# Patient Record
Sex: Male | Born: 1965 | Hispanic: No | Marital: Married | Smoking: Former smoker
Health system: Southern US, Community
[De-identification: ages and names within clinical notes are randomized; demographics above are authoritative.]

## PROBLEM LIST (undated history)

## (undated) DIAGNOSIS — E785 Hyperlipidemia, unspecified: Secondary | ICD-10-CM

## (undated) DIAGNOSIS — E0590 Thyrotoxicosis, unspecified without thyrotoxic crisis or storm: Secondary | ICD-10-CM

## (undated) DIAGNOSIS — R5383 Other fatigue: Secondary | ICD-10-CM

## (undated) DIAGNOSIS — M549 Dorsalgia, unspecified: Secondary | ICD-10-CM

## (undated) DIAGNOSIS — T7840XA Allergy, unspecified, initial encounter: Secondary | ICD-10-CM

## (undated) DIAGNOSIS — J45909 Unspecified asthma, uncomplicated: Secondary | ICD-10-CM

## (undated) DIAGNOSIS — R7309 Other abnormal glucose: Secondary | ICD-10-CM

## (undated) DIAGNOSIS — F411 Generalized anxiety disorder: Secondary | ICD-10-CM

## (undated) DIAGNOSIS — R5381 Other malaise: Secondary | ICD-10-CM

## (undated) DIAGNOSIS — I959 Hypotension, unspecified: Secondary | ICD-10-CM

## (undated) HISTORY — DX: Unspecified asthma, uncomplicated: J45.909

## (undated) HISTORY — DX: Generalized anxiety disorder: F41.1

## (undated) HISTORY — DX: Hyperlipidemia, unspecified: E78.5

## (undated) HISTORY — DX: Other abnormal glucose: R73.09

## (undated) HISTORY — DX: Hypotension, unspecified: I95.9

## (undated) HISTORY — DX: Other malaise: R53.81

## (undated) HISTORY — DX: Dorsalgia, unspecified: M54.9

## (undated) HISTORY — DX: Other fatigue: R53.83

## (undated) HISTORY — DX: Thyrotoxicosis, unspecified without thyrotoxic crisis or storm: E05.90

## (undated) HISTORY — DX: Allergy, unspecified, initial encounter: T78.40XA

---

## 2007-05-12 HISTORY — PX: COLONOSCOPY: SHX174

## 2009-01-15 ENCOUNTER — Ambulatory Visit: Payer: Self-pay | Admitting: Family Medicine

## 2009-01-15 DIAGNOSIS — E785 Hyperlipidemia, unspecified: Secondary | ICD-10-CM

## 2009-01-15 HISTORY — DX: Hyperlipidemia, unspecified: E78.5

## 2009-01-18 ENCOUNTER — Ambulatory Visit: Payer: Self-pay | Admitting: Family Medicine

## 2009-01-18 LAB — CONVERTED CEMR LAB
ALT: 24 units/L (ref 0–53)
AST: 20 units/L (ref 0–37)
Albumin: 4.1 g/dL (ref 3.5–5.2)
Alkaline Phosphatase: 35 units/L — ABNORMAL LOW (ref 39–117)
BUN: 15 mg/dL (ref 6–23)
Basophils Absolute: 0 10*3/uL (ref 0.0–0.1)
Bilirubin, Direct: 0 mg/dL (ref 0.0–0.3)
Chloride: 107 meq/L (ref 96–112)
Cholesterol: 217 mg/dL — ABNORMAL HIGH (ref 0–200)
Creatinine, Ser: 1.1 mg/dL (ref 0.4–1.5)
Direct LDL: 159.4 mg/dL
Eosinophils Relative: 4.4 % (ref 0.0–5.0)
GFR calc non Af Amer: 78.3 mL/min (ref >60)
HCT: 42.3 % (ref 39.0–52.0)
HDL: 42.3 mg/dL (ref >39.00)
MCV: 88.5 fL (ref 78.0–100.0)
Monocytes Absolute: 0.4 10*3/uL (ref 0.1–1.0)
Monocytes Relative: 8.8 % (ref 3.0–12.0)
Platelets: 146 10*3/uL — ABNORMAL LOW (ref 150.0–400.0)
Protein, U semiquant: NEGATIVE
RBC: 4.78 M/uL (ref 4.22–5.81)
Sodium: 141 meq/L (ref 135–145)
TSH: 1.85 microintl units/mL (ref 0.35–5.50)
Total Bilirubin: 0.9 mg/dL (ref 0.3–1.2)
Total CHOL/HDL Ratio: 5
Urobilinogen, UA: 0.2
WBC Urine, dipstick: NEGATIVE
pH: 5.5

## 2009-01-25 ENCOUNTER — Ambulatory Visit: Payer: Self-pay | Admitting: Family Medicine

## 2009-01-25 DIAGNOSIS — F411 Generalized anxiety disorder: Secondary | ICD-10-CM | POA: Insufficient documentation

## 2009-01-25 DIAGNOSIS — R7309 Other abnormal glucose: Secondary | ICD-10-CM

## 2009-01-25 HISTORY — DX: Generalized anxiety disorder: F41.1

## 2009-01-25 HISTORY — DX: Other abnormal glucose: R73.09

## 2009-01-29 ENCOUNTER — Encounter: Payer: Self-pay | Admitting: Family Medicine

## 2009-01-29 ENCOUNTER — Telehealth (INDEPENDENT_AMBULATORY_CARE_PROVIDER_SITE_OTHER): Payer: Self-pay | Admitting: *Deleted

## 2009-03-19 ENCOUNTER — Ambulatory Visit: Payer: Self-pay | Admitting: Family Medicine

## 2009-03-19 DIAGNOSIS — J45909 Unspecified asthma, uncomplicated: Secondary | ICD-10-CM

## 2009-03-19 HISTORY — DX: Unspecified asthma, uncomplicated: J45.909

## 2009-11-07 ENCOUNTER — Ambulatory Visit: Payer: Self-pay | Admitting: Family Medicine

## 2009-11-07 DIAGNOSIS — R5383 Other fatigue: Secondary | ICD-10-CM

## 2009-11-07 DIAGNOSIS — R5381 Other malaise: Secondary | ICD-10-CM

## 2009-11-07 DIAGNOSIS — I959 Hypotension, unspecified: Secondary | ICD-10-CM

## 2009-11-07 HISTORY — DX: Hypotension, unspecified: I95.9

## 2009-11-07 HISTORY — DX: Other malaise: R53.81

## 2009-11-08 ENCOUNTER — Ambulatory Visit: Payer: Self-pay | Admitting: Family Medicine

## 2009-11-12 LAB — CONVERTED CEMR LAB
BUN: 13 mg/dL (ref 6–23)
Basophils Absolute: 0 10*3/uL (ref 0.0–0.1)
Basophils Relative: 0.4 % (ref 0.0–3.0)
Calcium: 9 mg/dL (ref 8.4–10.5)
Cortisol, Plasma: 5.7 ug/dL
Creatinine, Ser: 0.8 mg/dL (ref 0.4–1.5)
Eosinophils Relative: 5.5 % — ABNORMAL HIGH (ref 0.0–5.0)
HCT: 39.2 % (ref 39.0–52.0)
MCHC: 33.8 g/dL (ref 30.0–36.0)
MCV: 86.9 fL (ref 78.0–100.0)
Monocytes Absolute: 0.7 10*3/uL (ref 0.1–1.0)
Neutrophils Relative %: 38 % — ABNORMAL LOW (ref 43.0–77.0)
Sodium: 142 meq/L (ref 135–145)
TSH: 0.05 microintl units/mL — ABNORMAL LOW (ref 0.35–5.50)
WBC: 4.6 10*3/uL (ref 4.5–10.5)

## 2009-11-13 ENCOUNTER — Ambulatory Visit: Payer: Self-pay | Admitting: Family Medicine

## 2009-11-13 LAB — CONVERTED CEMR LAB: TSH: 0.04 microintl units/mL — ABNORMAL LOW (ref 0.35–5.50)

## 2009-11-14 ENCOUNTER — Ambulatory Visit: Payer: Self-pay | Admitting: Family Medicine

## 2009-11-14 DIAGNOSIS — E0590 Thyrotoxicosis, unspecified without thyrotoxic crisis or storm: Secondary | ICD-10-CM

## 2009-11-14 HISTORY — DX: Thyrotoxicosis, unspecified without thyrotoxic crisis or storm: E05.90

## 2009-12-03 ENCOUNTER — Encounter (HOSPITAL_COMMUNITY): Admission: RE | Admit: 2009-12-03 | Discharge: 2010-01-30 | Payer: Self-pay | Admitting: Family Medicine

## 2009-12-11 ENCOUNTER — Encounter: Payer: Self-pay | Admitting: Family Medicine

## 2009-12-23 ENCOUNTER — Ambulatory Visit: Payer: Self-pay | Admitting: Family Medicine

## 2010-02-10 ENCOUNTER — Encounter: Payer: Self-pay | Admitting: Family Medicine

## 2010-06-04 ENCOUNTER — Ambulatory Visit
Admission: RE | Admit: 2010-06-04 | Discharge: 2010-06-04 | Payer: Self-pay | Source: Home / Self Care | Attending: Family Medicine | Admitting: Family Medicine

## 2010-06-04 DIAGNOSIS — M549 Dorsalgia, unspecified: Secondary | ICD-10-CM | POA: Insufficient documentation

## 2010-06-04 HISTORY — DX: Dorsalgia, unspecified: M54.9

## 2010-06-10 NOTE — Assessment & Plan Note (Signed)
Summary: consult re: thyroid issues/cjr   Vital Signs:  Patient profile:   45 year old male Temp:     98.3 degrees F oral BP sitting:   100 / 80  (left arm) Cuff size:   regular  Vitals Entered By: Sid Falcon LPN (December 23, 2009 4:37 PM) CC: consult   History of Present Illness: Recent issues of fatigue. Evaulation here signif for hyperthyroid. Nuclear scan showed changes c/w likely acute thyroiditis. Pt referred to Endo and antibody levels revealed no evidence for Graves.  At this point plan is for pt to have repeat thyroid levels at end of month.  No meds prescribed. Pt had some mild weight loss and mild tremors and these have resolved.  No diarrhea or palpitations. Pt was happy with care he received from Endo but a couple of his friends had suggested another endocrinologist in town.  Pt has hx mild hyperlipidemia and has some questions regarding that. He still has some fatigue and thinks this may me related partly to poor sleep. He is back to exercising some.  HDL low at 28 with recent labs at work.  Allergies (verified): No Known Drug Allergies  Past History:  Past Medical History: Last updated: 01/15/2009 Hyperlipidemia  Social History: Last updated: 01/15/2009 Occupation: Education officer, environmental Married Alcohol use-yes Former Smoker PMH reviewed for relevance, SH/Risk Factors reviewed for relevance  Review of Systems      See HPI  The patient denies anorexia, fever, weight loss, vision loss, hoarseness, chest pain, syncope, dyspnea on exertion, peripheral edema, headaches, abdominal pain, and muscle weakness.    Physical Exam  General:  Well-developed,well-nourished,in no acute distress; alert,appropriate and cooperative throughout examination Eyes:  pupils equal, pupils round, and pupils reactive to light.   Mouth:  Oral mucosa and oropharynx without lesions or exudates.  Teeth in good repair. Neck:  No deformities, masses, or tenderness  noted. Lungs:  Normal respiratory effort, chest expands symmetrically. Lungs are clear to auscultation, no crackles or wheezes. Heart:  Normal rate and regular rhythm. S1 and S2 normal without gallop, murmur, click, rub or other extra sounds. Extremities:  no edema.  No tremor noted. Neurologic:  alert & oriented X3, cranial nerves II-XII intact, strength normal in all extremities, and gait normal.   Skin:  no rashes.     Impression & Recommendations:  Problem # 1:  HYPERTHYROIDISM (ICD-242.90) Acute thyroiditis with symptoms resolving.  We explained some pts subsequently become hypothyroid and he is encouraged to follow up for repeat labs. Discussed issues with Endocrinology and he is encouraged to f/u with endo he is currently seeing. He is symptomatically stable.  Problem # 2:  HYPERLIPIDEMIA (ICD-272.4) pt has several questions regarding dyspipidemia.  Explained we would not rec recheck at this time until thyroid settles out.  Complete Medication List: 1)  No Meds

## 2010-06-10 NOTE — Assessment & Plan Note (Signed)
Summary: F/U ON LABWORK // RS   Vital Signs:  Patient profile:   45 year old male Weight:      169 pounds Temp:     98.8 degrees F oral Pulse rate:   68 / minute Pulse rhythm:   regular BP sitting:   110 / 72  (left arm)  Vitals Entered By: Sid Falcon LPN (November 15, 6043 8:25 AM)  History of Present Illness: Here for follow up .  Recent symptoms of fatigue and dyspnea with some activity. Slight tremor.  No diarrhea except for one episode 2 days ago.  Mild weight loss. Labs signif for low TSH and high T4 and T3.  No signif palpitations and no chest pain.  Overall symptoms stable since last visit. Had normal TSH last Fall.  Allergies (verified): No Known Drug Allergies  Past History:  Past Medical History: Last updated: 01/15/2009 Hyperlipidemia  Family History: Last updated: 01/15/2009 Family History High cholesterol  Social History: Last updated: 01/15/2009 Occupation: Education officer, environmental Married Alcohol use-yes Former Smoker  Risk Factors: Smoking Status: quit (01/15/2009) PMH-FH-SH reviewed for relevance  Review of Systems       The patient complains of weight loss.  The patient denies anorexia, fever, chest pain, syncope, peripheral edema, headaches, and abdominal pain.    Physical Exam  General:  Well-developed,well-nourished,in no acute distress; alert,appropriate and cooperative throughout examination Eyes:  no exopthalmous. pupils equal, pupils round, and pupils reactive to light.   Mouth:  Oral mucosa and oropharynx without lesions or exudates.  Teeth in good repair. Neck:  No deformities, masses, or tenderness noted. Lungs:  Normal respiratory effort, chest expands symmetrically. Lungs are clear to auscultation, no crackles or wheezes. Heart:  Normal rate and regular rhythm. S1 and S2 normal without gallop, murmur, click, rub or other extra sounds. Extremities:  no edema. Neurologic:  alert & oriented X3, cranial nerves II-XII intact, and  strength normal in all extremities.   Skin:  no rashes.     Impression & Recommendations:  Problem # 1:  HYPERTHYROIDISM (ICD-242.90) Assessment New thyroid uptake scan and endocrinology referral.  No betablocker with heart rate 68. Orders: Radiology Referral (Radiology) Endocrinology Referral (Endocrine)  Problem # 2:  FATIGUE (ICD-780.79) prob sec to #1.  Complete Medication List: 1)  No Meds   Patient Instructions: 1)  Follow up if you note any increased heart rate, palpitations, diarrhea, or rapid weight loss.

## 2010-06-10 NOTE — Letter (Signed)
Summary: [facility]   Imported By: Maryln Gottron 02/20/2010 14:08:34  _____________________________________________________________________  External Attachment:    Type:   Image     Comment:   External Document

## 2010-06-10 NOTE — Consult Note (Signed)
Summary: [facility] at [facility] at [facility]   Imported By: Maryln Gottron 02/12/2010 13:13:08  _____________________________________________________________________  External Attachment:    Type:   Image     Comment:   External Document

## 2010-06-10 NOTE — Assessment & Plan Note (Signed)
Summary: concerned that BP may be low? 100/62/dm   Vital Signs:  Patient profile:   45 year old male Weight:      168 pounds BMI:     25.64 O2 Sat:      97 % on Room air Temp:     97.8 degrees F oral BP sitting:   100 / 72  (left arm) BP standing:   96 / 60 Cuff size:   regular  Vitals Entered By: Sid Falcon LPN (November 07, 2009 10:04 AM)  O2 Flow:  Room air  Serial Vital Signs/Assessments:  Time      Position  BP       Pulse  Resp  Temp     By           Lying LA  98/60                          Evelena Peat MD           Standing  96/60                          Evelena Peat MD   History of Present Illness: Patient seen with concern for low blood pressure. No hypertension hx and on NO medications at this time.  He had several readings in the 90 systolic range recently. He has not however had any dizziness or orthostatic symptoms. He relates for approximately one month he has felt more fatigued. He did some [place] the mountains last month and denies any chest pain or exertional dyspnea at that time. Has developed some dyspnea at rest past couple of days. No orthopnea or paroxysmal nocturnal dyspnea. He denies any cough or wheeze.  No pleuritic pain or hemoptysis. New dog in the home past couple days but denies any nasal congestive symptoms. Fleeting posterior occipital headaches past couple days which have been mild. No visual changes. No focal weakness.  He does not take any medications. No recent concerns for dehydration. No history of anemia. No recent diarrhea, nausea, or vomiting. No definite fevers. In addition to profound weakness he has noticed that he has had very mild tremor in the hands past couple days.  No recent weight changes.  Allergies (verified): No Known Drug Allergies  Past History:  Past Medical History: Last updated: 01/15/2009 Hyperlipidemia  Family History: Last updated: 01/15/2009 Family History High cholesterol  Social History: Last  updated: 01/15/2009 Occupation: Education officer, environmental Married Alcohol use-yes Former Smoker  Risk Factors: Smoking Status: quit (01/15/2009) PMH-FH-SH reviewed for relevance  Review of Systems  The patient denies anorexia, fever, weight loss, vision loss, chest pain, syncope, peripheral edema, prolonged cough, hemoptysis, abdominal pain, melena, hematochezia, difficulty walking, depression, and enlarged lymph nodes.    Physical Exam  General:  Well-developed,well-nourished,in no acute distress; alert,appropriate and cooperative throughout examination Head:  Normocephalic and atraumatic without obvious abnormalities. No apparent alopecia or balding. Eyes:  No corneal or conjunctival inflammation noted. EOMI. Perrla. Funduscopic exam benign, without hemorrhages, exudates or papilledema. Vision grossly normal. Mouth:  tongue slightly dry otherwise clear Neck:  No deformities, masses, or tenderness noted. Lungs:  Normal respiratory effort, chest expands symmetrically. Lungs are clear to auscultation, no crackles or wheezes. Heart:  Normal rate and regular rhythm. S1 and S2 normal without gallop, murmur, click, rub or other extra sounds. Abdomen:  soft and non-tender.   Pulses:  R and L carotid,radial,femoral,dorsalis pedis  and posterior tibial pulses are full and equal bilaterally Extremities:  no edema. Neurologic:  alert & oriented X3, cranial nerves II-XII intact, strength normal in all extremities, gait normal, and DTRs symmetrical and normal.  no signif tremor at this time. Skin:  no rashes.   Cervical Nodes:  No lymphadenopathy noted Psych:  Oriented X3, memory intact for recent and remote, normally interactive, good eye contact, not anxious appearing, and not depressed appearing.     Impression & Recommendations:  Problem # 1:  FATIGUE (ICD-780.79) Assessment New ?related to low BP.  Check labs-BMP, CBC, TSH, Cortisol in AM.  Problem # 2:  LOW BLOOD PRESSURE  (ICD-458.9) Assessment: New No orthostatic change today.  ?mild dehydration.  Does not appear anemic clinically.   Check labs.  Problem # 3:  HYPERLIPIDEMIA (ICD-272.4) pt has hx hyperlipidemia and requests reassessment with upcoming labs.  Complete Medication List: 1)  No Meds   Patient Instructions: 1)  have scheduled the following labs for tomorrow morning: 2)  BMP, CBC,Cortisol level, TSH  780.79, 458.9 3)  Lipid panel  272.4 4)  Drink plenty of fluids and consider electrolyte replacement such as Gatorade

## 2010-06-12 NOTE — Assessment & Plan Note (Signed)
Summary: FATIGUE/?TSH/NJR   Vital Signs:  Patient profile:   45 year old male Weight:      172 pounds Temp:     98.0 degrees F oral BP sitting:   92 / 72  (left arm) Cuff size:   regular  Vitals Entered By: Sid Falcon LPN (June 04, 2010 4:12 PM)  History of Present Illness: Seen for the following.  Patient had recent lab work which revealed no immunity to Varicella. He was immune to measles, mumps, and rubella. Requesting Varicella vaccine. No contraindications.  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 Recent thyroid functions per endocrinologist were normal. Symptoms have resolved. He had some mild tachycardia and palpitations which have resolved. Overall energy levels have improved.  Chronic intermittent problem upper shoulder and neck pain. Frequent sensation of muscle tightness. Tends to carry tension in his upper back and neck. Has tried muscle massage, physical therapy, heat all with minimal improvement. Generally sleeping well. Aerobic exercise 3-4 days per week.  Pain is moderate.  Allergies (verified): No Known Drug Allergies  Past History:  Past Medical History: Last updated: 01/15/2009 Hyperlipidemia  Family History: Last updated: 01/15/2009 Family History High cholesterol  Social History: Last updated: 01/15/2009 Occupation: Education officer, environmental Married Alcohol use-yes Former Smoker  Risk Factors: Smoking Status: quit (01/15/2009) PMH-FH-SH reviewed for relevance  Review of Systems  The patient denies anorexia, fever, weight loss, weight gain, decreased hearing, hoarseness, chest pain, syncope, dyspnea on exertion, peripheral edema, prolonged cough, headaches, hemoptysis, abdominal pain, melena, hematochezia, severe indigestion/heartburn, muscle weakness, difficulty walking, depression, unusual weight change, enlarged lymph nodes, and testicular masses.    Physical Exam  General:  Well-developed,well-nourished,in no  acute distress; alert,appropriate and cooperative throughout examination Head:  Normocephalic and atraumatic without obvious abnormalities. No apparent alopecia or balding. Eyes:  No corneal or conjunctival inflammation noted. EOMI. Perrla. Funduscopic exam benign, without hemorrhages, exudates or papilledema. Vision grossly normal. Mouth:  Oral mucosa and oropharynx without lesions or exudates.  Teeth in good repair. Neck:  No deformities, masses, or tenderness noted. Lungs:  Normal respiratory effort, chest expands symmetrically. Lungs are clear to auscultation, no crackles or wheezes. Heart:  normal rate, regular rhythm, and no murmur.   Msk:  patient has significant muscle tenderness and tension to palpation trapezius muscles bilateral Extremities:  No clubbing, cyanosis, edema, or deformity noted with normal full range of motion of all joints.   Neurologic:  alert & oriented X3 and cranial nerves II-XII intact.   Skin:  no rashes and no suspicious lesions.   Cervical Nodes:  No lymphadenopathy noted   Impression & Recommendations:  Problem # 1:  BACK PAIN, UPPER (ICD-724.5)  continue heat, muscle massage, aerobic exercise. Consider physical therapy. Trial of low-dose cyclobenzaprine 5 mg q.h.s. His updated medication list for this problem includes:    Cyclobenzaprine Hcl 5 Mg Tabs (Cyclobenzaprine hcl) ..... One by mouth at bedtime as needed muscle spasm  Orders: Physical Therapy Referral (PT)  Problem # 2:  NEED PROPH VACC&INOCULAT AGAINST VARICELLA (ICD-V05.4) vaccine given.  Problem # 3:  HYPERTHYROIDISM (ICD-242.90) Assessment: Improved  Complete Medication List: 1)  No Meds  2)  Cyclobenzaprine Hcl 5 Mg Tabs (Cyclobenzaprine hcl) .... One by mouth at bedtime as needed muscle spasm  Other Orders: Varicella  (04540) Admin 1st Vaccine (98119)  Patient Instructions: 1)  Continue regular aerobic exercise 2)  Continue moist heat and muscle message. 3)  Consider repeat  physical therapy if symptoms persist. Prescriptions: CYCLOBENZAPRINE HCL 5  MG TABS (CYCLOBENZAPRINE HCL) one by mouth at bedtime as needed muscle spasm  #30 x 2   Entered and Authorized by:   Evelena Peat MD   Signed by:   Evelena Peat MD on 06/04/2010   Method used:   Electronically to        [facility] (retail)       [address]       [place], [place]  [address]       Ph: [phone] or [phone]       Fax: [phone]   RxID:   [id number]    Orders Added: 1)  Varicella  [90716] 2)  Admin 1st Vaccine [90471] 3)  Est. Patient Level IV [27253] 4)  Physical Therapy Referral [PT]   Immunizations Administered:  Varicella Vaccine # 1:    Vaccine Type: Varicella    Site: right deltoid    Mfr: Merck    Dose: 0.5 ml    Route: [place]    Given by: Sid Falcon LPN    Exp. Date: 10/10/2011    Lot #: [id number]   Immunizations Administered:  Varicella Vaccine # 1:    Vaccine Type: Varicella    Site: right deltoid    Mfr: Merck    Dose: 0.5 ml    Route: [place]    Given by: Sid Falcon LPN    Exp. Date: 10/10/2011    Lot #: [id number]

## 2010-12-24 ENCOUNTER — Other Ambulatory Visit (INDEPENDENT_AMBULATORY_CARE_PROVIDER_SITE_OTHER): Payer: BC Managed Care – PPO

## 2010-12-24 DIAGNOSIS — Z0000 Encounter for general adult medical examination without abnormal findings: Secondary | ICD-10-CM

## 2010-12-24 LAB — HEPATIC FUNCTION PANEL
ALT: 23 U/L (ref 0–53)
AST: 36 U/L (ref 0–37)
Albumin: 4.3 g/dL (ref 3.5–5.2)
Alkaline Phosphatase: 42 U/L (ref 39–117)
Total Bilirubin: 0.4 mg/dL (ref 0.3–1.2)
Total Protein: 7.5 g/dL (ref 6.0–8.3)

## 2010-12-24 LAB — CBC WITH DIFFERENTIAL/PLATELET
Basophils Absolute: 0 10*3/uL (ref 0.0–0.1)
Eosinophils Relative: 6 % — ABNORMAL HIGH (ref 0.0–5.0)
HCT: 42.7 % (ref 39.0–52.0)
Lymphocytes Relative: 45.7 % (ref 12.0–46.0)
Lymphs Abs: 2.2 10*3/uL (ref 0.7–4.0)
MCV: 88.4 fl (ref 78.0–100.0)
Monocytes Absolute: 0.4 10*3/uL (ref 0.1–1.0)
Monocytes Relative: 8.2 % (ref 3.0–12.0)
Neutro Abs: 1.9 10*3/uL (ref 1.4–7.7)
Platelets: 156 10*3/uL (ref 150.0–400.0)
RBC: 4.82 Mil/uL (ref 4.22–5.81)
RDW: 14.2 % (ref 11.5–14.6)
WBC: 4.8 10*3/uL (ref 4.5–10.5)

## 2010-12-24 LAB — POCT URINALYSIS DIPSTICK
Bilirubin, UA: NEGATIVE
Blood, UA: NEGATIVE
Leukocytes, UA: NEGATIVE
Protein, UA: NEGATIVE
Spec Grav, UA: 1.01

## 2010-12-24 LAB — LIPID PANEL
Cholesterol: 216 mg/dL — ABNORMAL HIGH (ref 0–200)
Total CHOL/HDL Ratio: 4
VLDL: 38 mg/dL (ref 0.0–40.0)

## 2010-12-24 LAB — BASIC METABOLIC PANEL
Chloride: 106 mEq/L (ref 96–112)
GFR: 81.86 mL/min (ref >60.00)
Potassium: 4.7 mEq/L (ref 3.5–5.1)

## 2010-12-31 ENCOUNTER — Encounter: Payer: Self-pay | Admitting: Family Medicine

## 2010-12-31 ENCOUNTER — Ambulatory Visit (INDEPENDENT_AMBULATORY_CARE_PROVIDER_SITE_OTHER): Payer: BC Managed Care – PPO | Admitting: Family Medicine

## 2010-12-31 VITALS — BP 100/70 | HR 60 | Temp 98.1°F | Resp 12 | Ht 67.5 in | Wt 167.0 lb

## 2010-12-31 DIAGNOSIS — Z0000 Encounter for general adult medical examination without abnormal findings: Secondary | ICD-10-CM

## 2010-12-31 LAB — VITAMIN B12: Vitamin B-12: 395 pg/mL (ref 211–911)

## 2010-12-31 MED ORDER — TRIAMCINOLONE ACETONIDE 0.1 % EX CREA: TOPICAL_CREAM | Freq: Two times a day (BID) | CUTANEOUS | Status: DC

## 2010-12-31 MED ORDER — ESCITALOPRAM OXALATE 10 MG PO TABS: 10.0000 mg | ORAL_TABLET | Freq: Every day | ORAL | Status: DC

## 2010-12-31 NOTE — Progress Notes (Signed)
Subjective:    Patient ID: Curtis Rodriguez, male    DOB: 09/29/1965, 45 y.o.   MRN: [id number]  HPI Patient seen for complete physical examination. He has history of transient hyperthyroidism which is resolved at this time. Prior history of mild dyslipidemia. He has history of chronic low-grade anxiety and takes very low dose Lexapro which seems to control. Recently started [employer].  No dairy products. Requesting B12 level. He made this dietary change about 5 months ago. Patient relates tetanus booster about 6 months ago.  Some chronic cervical neck and upper back pain which she relates to job stress. Exercising with running which seems to help somewhat. Exacerbated by weight lifting. No radiculopathy symptoms.  Social history and family history reviewed. Father with type 2 diabetes  Past Medical History  Diagnosis Date  . HYPERTHYROIDISM 11/14/2009  . HYPERLIPIDEMIA 01/15/2009  . Anxiety state, unspecified 01/25/2009  . LOW BLOOD PRESSURE 11/07/2009  . 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 FATIGUE 11/07/2009  . PREDIABETES 01/25/2009  . BACK PAIN, UPPER 06/04/2010   History reviewed. No pertinent past surgical history.  reports that he quit smoking about 4 years ago. His smoking use included Cigarettes. He has a 17.6 pack-year smoking history. He does not have any smokeless tobacco history on file. His alcohol and drug histories not on file. family history includes Diabetes in his mother and Hypertension in his mother. No Known Allergies    Review of Systems  Constitutional: Negative for fever, activity change, appetite change and fatigue.  HENT: Negative for ear pain, congestion and trouble swallowing.   Eyes: Negative for pain and visual disturbance.  Respiratory: Negative for cough, shortness of breath and wheezing.   Cardiovascular: Negative for chest pain and palpitations.  Gastrointestinal: Negative for nausea, vomiting, abdominal pain, diarrhea, constipation, blood in stool, abdominal  distention and rectal pain.  Genitourinary: Negative for dysuria, hematuria and testicular pain.  Musculoskeletal: Positive for back pain. Negative for myalgias, joint swelling and arthralgias.  Skin: Negative for rash.  Neurological: Negative for dizziness, syncope, weakness and headaches.  Hematological: Negative for adenopathy.  Psychiatric/Behavioral: Negative for confusion and dysphoric mood.       Objective:   Physical Exam  Constitutional: He is oriented to person, place, and time. He appears well-developed and well-nourished. No distress.  HENT:  Head: Normocephalic and atraumatic.  Right Ear: External ear normal.  Left Ear: External ear normal.  Mouth/Throat: Oropharynx is clear and moist.  Eyes: Conjunctivae and EOM are normal. Pupils are equal, round, and reactive to light.  Neck: Normal range of motion. Neck supple. No thyromegaly present.  Cardiovascular: Normal rate, regular rhythm and normal heart sounds.   No murmur heard. Pulmonary/Chest: No respiratory distress. He has no wheezes. He has no rales.  Abdominal: Soft. Bowel sounds are normal. He exhibits no distension and no mass. There is no tenderness. There is no rebound and no guarding.  Genitourinary: Rectum normal and prostate normal.  Musculoskeletal: He exhibits no edema.  Lymphadenopathy:    He has no cervical adenopathy.  Neurological: He is alert and oriented to person, place, and time. He displays normal reflexes. No cranial nerve deficit.  Skin: No rash noted.  Psychiatric: He has a normal mood and affect. His behavior is normal.          Assessment & Plan:  Health maintenance exam. Labs reviewed with patient and all basically favorable. Add B12 and vitamin D level to recent labs. Patient has increased risk for B12 deficiency with  current diet. Confirm date of last tetanus. Exercise discussed

## 2011-01-02 ENCOUNTER — Telehealth: Payer: Self-pay | Admitting: Family Medicine

## 2011-01-02 ENCOUNTER — Other Ambulatory Visit: Payer: Self-pay | Admitting: Family Medicine

## 2011-01-02 MED ORDER — ERGOCALCIFEROL 1.25 MG (50000 UT) PO CAPS: 50000.0000 [IU] | ORAL_CAPSULE | ORAL | Status: AC

## 2011-01-02 NOTE — Telephone Encounter (Signed)
Pt called 8/24. Stated he spoke with you or you called this morning and said his Vitamin D would be called into the [facility] on [address]. He does not have it yet and was checking in. Please call.

## 2011-01-02 NOTE — Progress Notes (Signed)
Quick Note:  Pt informed, Rx sent, future lab order done ______

## 2011-01-02 NOTE — Telephone Encounter (Signed)
Rx sent, pt informed. 

## 2011-04-30 ENCOUNTER — Ambulatory Visit (INDEPENDENT_AMBULATORY_CARE_PROVIDER_SITE_OTHER): Payer: BC Managed Care – PPO | Admitting: Family Medicine

## 2011-04-30 ENCOUNTER — Encounter: Payer: Self-pay | Admitting: Family Medicine

## 2011-04-30 DIAGNOSIS — J111 Influenza due to unidentified influenza virus with other respiratory manifestations: Secondary | ICD-10-CM

## 2011-04-30 MED ORDER — OSELTAMIVIR PHOSPHATE 75 MG PO CAPS: 75.0000 mg | ORAL_CAPSULE | Freq: Two times a day (BID) | ORAL | Status: AC

## 2011-04-30 NOTE — Patient Instructions (Signed)
Influenza Facts Flu (influenza) is a contagious respiratory illness caused by the influenza viruses. It can cause mild to severe illness. While most healthy people recover from the flu without specific treatment and without complications, older people, young children, and people with certain health conditions are at higher risk for serious complications from the flu, including death. CAUSES   The flu virus is spread from person to person by respiratory droplets from coughing and sneezing.   A person can also become infected by touching an object or surface with a virus on it and then touching their mouth, eye or nose.   Adults may be able to infect others from 1 day before symptoms occur and up to 7 days after getting sick. So it is possible to give someone the flu even before you know you are sick and continue to infect others while you are sick.  SYMPTOMS   Fever (usually high).   Headache.   Tiredness (can be extreme).   Cough.   Sore throat.   Runny or stuffy nose.   Body aches.   Diarrhea and vomiting may also occur, particularly in children.   These symptoms are referred to as "flu-like symptoms". A lot of different illnesses, including the common cold, can have similar symptoms.  DIAGNOSIS   There are tests that can determine if you have the flu as long you are tested within the first 2 or 3 days of illness.   A doctor's exam and additional tests may be needed to identify if you have a disease that is a complicating the flu.  RISKS AND COMPLICATIONS  Some of the complications caused by the flu include:  Bacterial pneumonia or progressive pneumonia caused by the flu virus.   Loss of body fluids (dehydration).   Worsening of chronic medical conditions, such as heart failure, asthma, or diabetes.   Sinus problems and ear infections.  HOME CARE INSTRUCTIONS   Seek medical care early on.   If you are at high risk from complications of the flu, consult your health-care  provider as soon as you develop flu-like symptoms. Those at high risk for complications include:   People 65 years or older.   People with chronic medical conditions, including diabetes.   Pregnant women.   Young children.   Your caregiver may recommend use of an antiviral medication to help treat the flu.   If you get the flu, get plenty of rest, drink a lot of liquids, and avoid using alcohol and tobacco.   You can take over-the-counter medications to relieve the symptoms of the flu if your caregiver approves. (Never give aspirin to children or teenagers who have flu-like symptoms, particularly fever).  PREVENTION  The single best way to prevent the flu is to get a flu vaccine each fall. Other measures that can help protect against the flu are:  Antiviral Medications   A number of antiviral drugs are approved for use in preventing the flu. These are prescription medications, and a doctor should be consulted before they are used.   Habits for Good Health   Cover your nose and mouth with a tissue when you cough or sneeze, throw the tissue away after you use it.   Wash your hands often with soap and water, especially after you cough or sneeze. If you are not near water, use an alcohol-based hand cleaner.   Avoid people who are sick.   If you get the flu, stay home from work or school. Avoid contact with   other people so that you do not make them sick, too.   Try not to touch your eyes, nose, or mouth as germs ore often spread this way.  IN CHILDREN, EMERGENCY WARNING SIGNS THAT NEED URGENT MEDICAL ATTENTION:  Fast breathing or trouble breathing.   Bluish skin color.   Not drinking enough fluids.   Not waking up or not interacting.   Being so irritable that the child does not want to be held.   Flu-like symptoms improve but then return with fever and worse cough.   Fever with a rash.  IN ADULTS, EMERGENCY WARNING SIGNS THAT NEED URGENT MEDICAL ATTENTION:  Difficulty  breathing or shortness of breath.   Pain or pressure in the chest or abdomen.   Sudden dizziness.   Confusion.   Severe or persistent vomiting.  SEEK IMMEDIATE MEDICAL CARE IF:  You or someone you know is experiencing any of the symptoms above. When you arrive at the emergency center,report that you think you have the flu. You may be asked to wear a mask and/or sit in a secluded area to protect others from getting sick. MAKE SURE YOU:   Understand these instructions.   Monitor your condition.   Seek medical care if you are getting worse, or not improving.  Document Released: 04/30/2003 Document Revised: 01/07/2011 Document Reviewed: 01/24/2009 Exit[employer] Patient Information 2012 [employer]. 

## 2011-04-30 NOTE — Progress Notes (Signed)
  Subjective:    Patient ID: Curtis Rodriguez, male    DOB: 19-Dec-1965, 45 y.o.   MRN: [id number]  HPI  Acute visit. Patient relates onset about 40 hours ago of increased sneezing, nasal congestion, fatigue, body aches, chills, and sore throat. No cough. Daughter is sick with somewhat similar symptoms. Patient did not receive flu vaccine. Denies any nausea, vomiting, or diarrhea.   Review of Systems  Constitutional: Positive for chills and fatigue.  HENT: Positive for congestion and sore throat.   Respiratory: Negative for cough.   Cardiovascular: Negative for chest pain.  Neurological: Positive for headaches.       Objective:   Physical Exam  Constitutional: He appears well-developed and well-nourished. No distress.  HENT:  Right Ear: External ear normal.  Left Ear: External ear normal.  Mouth/Throat: Oropharynx is clear and moist.  Neck: Neck supple.  Cardiovascular: Normal rate and regular rhythm.   Pulmonary/Chest: Effort normal and breath sounds normal. No respiratory distress. He has no wheezes. He has no rales.  Lymphadenopathy:    He has no cervical adenopathy.          Assessment & Plan:  Probable viral syndrome. Check influenza screen. If negative treat symptomatically. If positive consider Tamiflu Influenza screen positive.  Tamiflu 75 mg po bid for 5 days.

## 2011-05-08 ENCOUNTER — Other Ambulatory Visit: Payer: Self-pay | Admitting: Family Medicine

## 2011-05-08 NOTE — Telephone Encounter (Signed)
Pt was informed on his personally identified VM that refill of Tamiflu was unlikely.  I was hoping to talk to pt to gather additional information and symptoms.  Per Dr Lovell Sheehan, refill not indicated.

## 2011-05-08 NOTE — Telephone Encounter (Signed)
Pt called req refill of Tamiflu to [facility] on [facility].

## 2011-05-11 ENCOUNTER — Encounter: Payer: Self-pay | Admitting: Family Medicine

## 2011-05-11 ENCOUNTER — Ambulatory Visit (INDEPENDENT_AMBULATORY_CARE_PROVIDER_SITE_OTHER): Payer: BC Managed Care – PPO | Admitting: Family Medicine

## 2011-05-11 VITALS — BP 98/70 | Temp 97.9°F

## 2011-05-11 DIAGNOSIS — Z113 Encounter for screening for infections with a predominantly sexual mode of transmission: Secondary | ICD-10-CM

## 2011-05-11 DIAGNOSIS — J029 Acute pharyngitis, unspecified: Secondary | ICD-10-CM

## 2011-05-11 NOTE — Progress Notes (Signed)
  Subjective:    Patient ID: Curtis Rodriguez, male    DOB: Oct 06, 1965, 45 y.o.   MRN: [id number]  HPI  Acute visit. Recent influenza. Overall better but recurrence of sore throat last week. Fever has resolved. Denies cough. No post nasal drip symptoms. Denies body aches. No nausea or vomiting. No sick contacts.  Patient also requesting STD screening. Denies any symptoms such as dysuria, burning, urine frequency, or any skin rash. No prior history of STD   Review of Systems  HENT: Positive for sore throat. Negative for congestion, mouth sores and sinus pressure.   Respiratory: Negative for cough and shortness of breath.   Cardiovascular: Negative for chest pain.  Gastrointestinal: Negative for nausea and vomiting.  Genitourinary: Negative for dysuria, discharge, genital sores and penile pain.  Skin: Negative for rash.  Neurological: Negative for dizziness and headaches.  Hematological: Negative for adenopathy.       Objective:   Physical Exam  Constitutional: He appears well-developed and well-nourished.  HENT:  Right Ear: External ear normal.  Left Ear: External ear normal.       Minimal erythema posterior pharynx. No exudate  Neck: Neck supple.  Cardiovascular: Normal rate and regular rhythm.   Pulmonary/Chest: Effort normal and breath sounds normal. No respiratory distress. He has no wheezes. He has no rales.  Lymphadenopathy:    He has no cervical adenopathy.          Assessment & Plan:  #1 acute pharyngitis. Probably viral. Check rapid strep. If negative treat symptomatically #2 STD screening. Check HIV, RPR, GC, and Chlamydia.  Discussed STD prevention.

## 2011-05-12 LAB — GC/CHLAMYDIA PROBE AMP, URINE: Chlamydia, Swab/Urine, PCR: NEGATIVE

## 2011-05-12 LAB — HIV ANTIBODY (ROUTINE TESTING W REFLEX): HIV: NONREACTIVE

## 2011-05-14 NOTE — Progress Notes (Signed)
Quick Note:  Pt aware ______ 

## 2011-05-27 ENCOUNTER — Encounter: Payer: Self-pay | Admitting: Family Medicine

## 2011-05-27 ENCOUNTER — Ambulatory Visit (INDEPENDENT_AMBULATORY_CARE_PROVIDER_SITE_OTHER): Payer: BC Managed Care – PPO | Admitting: Family Medicine

## 2011-05-27 VITALS — BP 110/70 | Temp 97.7°F

## 2011-05-27 DIAGNOSIS — J029 Acute pharyngitis, unspecified: Secondary | ICD-10-CM

## 2011-05-27 LAB — POCT RAPID STREP A (OFFICE): Rapid Strep A Screen: NEGATIVE

## 2011-05-27 NOTE — Progress Notes (Signed)
  Subjective:    Patient ID: Curtis Rodriguez, male    DOB: October 15, 1965, 46 y.o.   MRN: [id number]  HPI  Patient seen with sore throat. Had recent influenza and symptoms eventually resolved. Last weekend when he was traveling on business had sore throat occasional cough. Mild body aches. Mild headache. Sore throat actually improved somewhat today. No purulent nasal discharge. No definite fever. No sick exposures.   Review of Systems As per history of present illness    Objective:   Physical Exam  HENT:  Head: Normocephalic and atraumatic.  Right Ear: External ear normal.  Left Ear: External ear normal.       Patient some posterior pharynx erythema. No exudate  Neck: Neck supple.  Cardiovascular: Normal rate and regular rhythm.   Pulmonary/Chest: He has no wheezes. He has no rales.  Lymphadenopathy:    He has cervical adenopathy.          Assessment & Plan:  Pharyngitis. Suspect viral. Rapid strep. If negative treat symptomatically

## 2011-05-27 NOTE — Patient Instructions (Signed)

## 2011-07-14 IMAGING — NM NM THYROID IMAGING W/ UPTAKE SINGLE (24 HR)
1 series · 1 of 1 positions shown · non-contrast
Comparison: None.

12/06/2009 – CORRECTED EXAM ORDER:  This exam was incorrectly ordered and should be NM THYROID UPTAKE 24 HOURS.  Charges to the patient’s account have been corrected, and no changes were made to the original report.
CLINICAL DATA: Hyperthyroidism.

 THYROID UPTAKE - 24 HOURS
TECHNIQUE: Following the per oral administration of X-101 sodium
 iodide, the patient returned at 24 hours and uptake measurements
 were acquired with the uptake probe centered on the neck.
 [employer]: 12.2 uCi X-101 Sodium Iodide.

[th thyroid scan · 1.14mm/px · 1 of 1 slices shown]
[im 1/1]
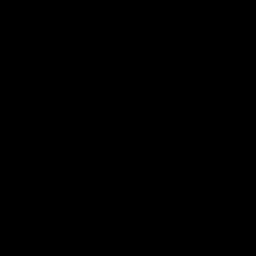

[1 of 1 positions shown; findings below may reference images not displayed]

FINDINGS: The 24 hour X-101 uptake was 0.5%. This could be due to
 acute thyroiditis or exogenous iodine uptake.
IMPRESSION: Very low 24 hour X-101 uptake at 0.5%.

## 2011-08-10 ENCOUNTER — Encounter: Payer: Self-pay | Admitting: Family

## 2011-08-10 ENCOUNTER — Ambulatory Visit (INDEPENDENT_AMBULATORY_CARE_PROVIDER_SITE_OTHER): Payer: BC Managed Care – PPO | Admitting: Family

## 2011-08-10 VITALS — BP 110/80 | Temp 98.0°F | Wt 170.0 lb

## 2011-08-10 DIAGNOSIS — E559 Vitamin D deficiency, unspecified: Secondary | ICD-10-CM

## 2011-08-10 DIAGNOSIS — R5381 Other malaise: Secondary | ICD-10-CM

## 2011-08-10 DIAGNOSIS — R5383 Other fatigue: Secondary | ICD-10-CM

## 2011-08-10 MED ORDER — KETOROLAC TROMETHAMINE 60 MG/2ML IM SOLN: 60.0000 mg | Freq: Once | INTRAMUSCULAR | Status: AC

## 2011-08-10 MED ORDER — NAPROXEN 500 MG PO TABS: 500.0000 mg | ORAL_TABLET | Freq: Two times a day (BID) | ORAL | Status: DC

## 2011-08-10 NOTE — Patient Instructions (Signed)
Cervical Radiculopathy Cervical radiculopathy happens when a nerve in the neck is pinched or bruised by a slipped (herniated) disk or by arthritic changes in the bones of the cervical spine. This can occur due to an injury or as part of the normal aging process. Pressure on the cervical nerves can cause pain or numbness that runs from your neck all the way down into your arm and fingers. CAUSES  There are many possible causes, including:  Injury.   Muscle tightness in the neck from overuse.   Swollen, painful joints (arthritis).   Breakdown or degeneration in the bones and joints of the spine (spondylosis) due to aging.   Bone spurs that may develop near the cervical nerves.  SYMPTOMS  Symptoms include pain, weakness, or numbness in the affected arm and hand. Pain can be severe or irritating. Symptoms may be worse when extending or turning the neck. DIAGNOSIS  Your caregiver will ask about your symptoms and do a physical exam. He or she may test your strength and reflexes. X-rays, CT scans, and MRI scans may be needed in cases of injury or if the symptoms do not go away after a period of time. Electromyography (EMG) or nerve conduction testing may be done to study how your nerves and muscles are working. TREATMENT  Your caregiver may recommend certain exercises to help relieve your symptoms. Cervical radiculopathy can, and often does, get better with time and treatment. If your problems continue, treatment options may include:  Wearing a soft collar for short periods of time.   Physical therapy to strengthen the neck muscles.   Medicines, such as nonsteroidal anti-inflammatory drugs (NSAIDs), oral corticosteroids, or spinal injections.   Surgery. Different types of surgery may be done depending on the cause of your problems.  HOME CARE INSTRUCTIONS   Put ice on the affected area.   Put ice in a plastic bag.   Place a towel between your skin and the bag.   Leave the ice on for 15  to 20 minutes, 3 to 4 times a day or as directed by your caregiver.   Use a flat pillow when you sleep.   Only take over-the-counter or prescription medicines for pain, discomfort, or fever as directed by your caregiver.   If physical therapy was prescribed, follow your caregiver's directions.   If a soft collar was prescribed, use it as directed.  SEEK IMMEDIATE MEDICAL CARE IF:   Your pain gets much worse and cannot be controlled with medicines.   You have weakness or numbness in your hand, arm, face, or leg.   You have a high fever or a stiff, rigid neck.   You lose bowel or bladder control (incontinence).   You have trouble with walking, balance, or speaking.  MAKE SURE YOU:   Understand these instructions.   Will watch your condition.   Will get help right away if you are not doing well or get worse.  Document Released: 01/20/2001 Document Revised: 04/16/2011 Document Reviewed: 12/09/2010 [employer] Patient Information 2012 [employer]. 

## 2011-08-10 NOTE — Progress Notes (Signed)
Subjective:    Patient ID: Curtis Rodriguez, male    DOB: [birth year], 46 y.o.   MRN: [id number]  HPI Comments: C/o fatigue x three days. Was told vitamin D levels was low and was placed on vitamin d for the last 3-4 months and was instructed to stop taking and repeat labs. Also c/o chronic neck and bilat shoulder pain related to injury 8 years ago. MRI completed at time of injury. Rates pain as 8/10 and pain gets worse when attempting to work out at the gym or perform yard work. Moist heat helps to alleviate discomfort. Described as Nagging discomfort. Takes OTC advil occasionally with some relief.      Review of Systems  Constitutional: Positive for appetite change.  Respiratory: Negative.   Cardiovascular: Negative.   Musculoskeletal: Positive for arthralgias. Negative for myalgias, back pain, joint swelling and gait problem.   Past Medical History  Diagnosis Date  . HYPERTHYROIDISM 11/14/2009  . HYPERLIPIDEMIA 01/15/2009  . Anxiety state, unspecified 01/25/2009  . LOW BLOOD PRESSURE 11/07/2009  . 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 FATIGUE 11/07/2009  . PREDIABETES 01/25/2009  . BACK PAIN, UPPER 06/04/2010    History   Social History  . Marital Status: Married    Spouse Name: N/A    Number of Children: N/A  . Years of Education: N/A   Occupational History  . Not on file.   Social History Main Topics  . Smoking status: Former Smoker -- 0.8 packs/day for 22 years    Types: Cigarettes    Quit date: 12/31/2006  . Smokeless tobacco: Not on file  . Alcohol Use: Not on file  . Drug Use: Not on file  . Sexually Active: Not on file   Other Topics Concern  . Not on file   Social History Narrative  . No narrative on file    No past surgical history on file.  Family History  Problem Relation Age of Onset  . Diabetes Mother   . Hypertension Mother     No Known Allergies  Current Outpatient Prescriptions on File Prior to Visit  Medication Sig Dispense Refill  . ergocalciferol  (VITAMIN D2) 50000 UNITS capsule Take 1 capsule (50,000 Units total) by mouth once a week.  4 capsule  12  . escitalopram (LEXAPRO) 10 MG tablet Take 1 tablet (10 mg total) by mouth daily.  30 tablet  3  . triamcinolone (KENALOG) 0.1 % cream Apply topically 2 (two) times daily.  30 g  3   No current facility-administered medications on file prior to visit.    BP 110/80  Temp(Src) 98 F (36.7 C) (Oral)  Wt 170 lb (77.111 kg)chart     Objective:   Physical Exam  Constitutional: He is oriented to person, place, and time. He appears well-developed and well-nourished. No distress.  Cardiovascular: Normal rate, regular rhythm, normal heart sounds and intact distal pulses.  Exam reveals no gallop and no friction rub.   No murmur heard. Pulmonary/Chest: Effort normal. No respiratory distress. He has no wheezes. He has no rales. He exhibits no tenderness.  Musculoskeletal: Normal range of motion. He exhibits no edema and no tenderness.  Neurological: He is alert and oriented to person, place, and time.  Skin: Skin is warm and dry. He is not diaphoretic.          Assessment & Plan:  Assessment: Chronic neck and shoulder pain, fatigue, vitamin d deficiency Plan: Labs: CBC, BMP, vitamin d level,  naproxen, teaching handout provided on  diagnosis and treatment Encouraged to RTC if fatigue or pain gets worse

## 2011-08-11 LAB — CBC WITH DIFFERENTIAL/PLATELET
Basophils Relative: 0.6 % (ref 0.0–3.0)
Eosinophils Absolute: 0.5 10*3/uL (ref 0.0–0.7)
HCT: 41.5 % (ref 39.0–52.0)
Hemoglobin: 13.9 g/dL (ref 13.0–17.0)
MCHC: 33.4 g/dL (ref 30.0–36.0)
MCV: 87.3 fl (ref 78.0–100.0)
Monocytes Relative: 5.9 % (ref 3.0–12.0)
Neutrophils Relative %: 44.7 % (ref 43.0–77.0)
Platelets: 168 10*3/uL (ref 150.0–400.0)
RDW: 13.7 % (ref 11.5–14.6)
WBC: 5.3 10*3/uL (ref 4.5–10.5)

## 2011-08-11 LAB — BASIC METABOLIC PANEL
Calcium: 9.2 mg/dL (ref 8.4–10.5)
Chloride: 103 mEq/L (ref 96–112)
Glucose, Bld: 101 mg/dL — ABNORMAL HIGH (ref 70–99)

## 2011-08-11 LAB — TSH: TSH: 1.41 u[IU]/mL (ref 0.35–5.50)

## 2011-08-12 LAB — VITAMIN D 1,25 DIHYDROXY
Vitamin D 1, 25 (OH)2 Total: 42 pg/mL (ref 18–72)
Vitamin D3 1, 25 (OH)2: 20 pg/mL

## 2011-11-23 ENCOUNTER — Ambulatory Visit (INDEPENDENT_AMBULATORY_CARE_PROVIDER_SITE_OTHER): Payer: BC Managed Care – PPO | Admitting: Family Medicine

## 2011-11-23 ENCOUNTER — Encounter: Payer: Self-pay | Admitting: Family Medicine

## 2011-11-23 VITALS — BP 102/72 | Temp 97.8°F | Wt 171.0 lb

## 2011-11-23 DIAGNOSIS — E559 Vitamin D deficiency, unspecified: Secondary | ICD-10-CM

## 2011-11-23 DIAGNOSIS — E785 Hyperlipidemia, unspecified: Secondary | ICD-10-CM

## 2011-11-23 LAB — LIPID PANEL
Cholesterol: 231 mg/dL — ABNORMAL HIGH (ref 0–200)
Triglycerides: 126 mg/dL (ref 0.0–149.0)

## 2011-11-23 NOTE — Progress Notes (Signed)
  Subjective:    Patient ID: Curtis Rodriguez, male    DOB: [birth year], 46 y.o.   MRN: [id number]  HPI  To discuss the following items  Hyperlipidemia. Recent health screening at work. Total cholesterol 239, HDL 39 and glucose 96. No LDL done. No triglycerides. No family history of premature CAD. Patient has recently gained some weight. He is exercising about 3 days per week. No chest pains. Nonsmoker. No history of hypertension.  History of vitamin D deficiency. Vitamin D level last year 35. Takes vitamin D 50,000 units weekly but occasionally skips doses.  Past Medical History  Diagnosis Date  . HYPERTHYROIDISM 11/14/2009  . HYPERLIPIDEMIA 01/15/2009  . Anxiety state, unspecified 01/25/2009  . LOW BLOOD PRESSURE 11/07/2009  . 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 FATIGUE 11/07/2009  . PREDIABETES 01/25/2009  . BACK PAIN, UPPER 06/04/2010   No past surgical history on file.  reports that he quit smoking about 4 years ago. His smoking use included Cigarettes. He has a 17.6 pack-year smoking history. He does not have any smokeless tobacco history on file. His alcohol and drug histories not on file. family history includes Diabetes in his mother and Hypertension in his mother. No Known Allergies    Review of Systems  Constitutional: Negative for appetite change and unexpected weight change.  Respiratory: Negative for cough and shortness of breath.   Cardiovascular: Negative for chest pain.  Neurological: Negative for dizziness.       Objective:   Physical Exam  Constitutional: He appears well-developed and well-nourished.  Neck: Neck supple. No thyromegaly present.  Cardiovascular: Normal rate and regular rhythm.   No murmur heard. Pulmonary/Chest: Effort normal and breath sounds normal. No respiratory distress. He has no wheezes. He has no rales.  Musculoskeletal: He exhibits no edema.          Assessment & Plan:  #1 dyslipidemia. Discussed importance of weight control and regular  exercise. Recheck lipid panel. Overall fairly low risk for CAD #2 low vitamin D. Recheck 25-hydroxy vitamin D level.

## 2011-11-24 NOTE — Progress Notes (Signed)
Quick Note:  Pt informed, copy mailed to home ______ 

## 2011-11-24 NOTE — Progress Notes (Signed)
Quick Note:  Pt informed, copy mailed to his home ______ 

## 2012-05-13 ENCOUNTER — Other Ambulatory Visit: Payer: Self-pay | Admitting: Family Medicine

## 2012-05-14 ENCOUNTER — Other Ambulatory Visit: Payer: Self-pay | Admitting: Family Medicine

## 2012-05-16 ENCOUNTER — Other Ambulatory Visit: Payer: Self-pay | Admitting: *Deleted

## 2012-05-16 MED ORDER — ESCITALOPRAM OXALATE 10 MG PO TABS: 10.0000 mg | ORAL_TABLET | Freq: Every day | ORAL | Status: DC

## 2012-06-27 ENCOUNTER — Ambulatory Visit (INDEPENDENT_AMBULATORY_CARE_PROVIDER_SITE_OTHER): Payer: BC Managed Care – PPO | Admitting: Family Medicine

## 2012-06-27 ENCOUNTER — Encounter: Payer: Self-pay | Admitting: Family Medicine

## 2012-06-27 VITALS — BP 120/90 | HR 72 | Temp 98.3°F | Wt 168.0 lb

## 2012-06-27 DIAGNOSIS — J069 Acute upper respiratory infection, unspecified: Secondary | ICD-10-CM

## 2012-06-27 MED ORDER — AZITHROMYCIN 250 MG PO TABS: ORAL_TABLET | ORAL | Status: DC

## 2012-06-27 MED ORDER — ALBUTEROL SULFATE HFA 108 (90 BASE) MCG/ACT IN AERS: 2.0000 | INHALATION_SPRAY | Freq: Four times a day (QID) | RESPIRATORY_TRACT | Status: DC | PRN

## 2012-06-27 NOTE — Progress Notes (Signed)
Chief Complaint  Patient presents with  . Cough    chest and throat congestion, heavy breathing, pain in head, body aches 1 week    HPI:  Acute visit for cough: -started: about 1 week ago -symptoms: chest congestion, cough, HA, mild SOB, sinus congestion,drainage in throat, body aches -denies: NVD -has tried ginger tea -in the past when had URIs has used advair in the past -sick contacts: at work  ROS: See pertinent positives and negatives per HPI.  Past Medical History  Diagnosis Date  . HYPERTHYROIDISM 11/14/2009  . HYPERLIPIDEMIA 01/15/2009  . Anxiety state, unspecified 01/25/2009  . LOW BLOOD PRESSURE 11/07/2009  . 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 FATIGUE 11/07/2009  . PREDIABETES 01/25/2009  . BACK PAIN, UPPER 06/04/2010    Family History  Problem Relation Age of Onset  . Diabetes Mother   . Hypertension Mother     History   Social History  . Marital Status: Married    Spouse Name: N/A    Number of Children: N/A  . Years of Education: N/A   Social History Main Topics  . Smoking status: Former Smoker -- 0.80 packs/day for 22 years    Types: Cigarettes    Quit date: 12/31/2006  . Smokeless tobacco: None  . Alcohol Use: None  . Drug Use: None  . Sexually Active: None   Other Topics Concern  . None   Social History Narrative  . None    Current outpatient prescriptions:albuterol (PROVENTIL HFA;VENTOLIN HFA) 108 (90 BASE) MCG/ACT inhaler, Inhale 2 puffs into the lungs every 6 (six) hours as needed for wheezing., Disp: 1 Inhaler, Rfl: 0;  azithromycin (ZITHROMAX) 250 MG tablet, 2 tabs on the first day, then one tab daily for 4 days, Disp: 6 tablet, Rfl: 0;  escitalopram (LEXAPRO) 10 MG tablet, Take 1 tablet (10 mg total) by mouth daily., Disp: 90 tablet, Rfl: 3  EXAM:  Filed Vitals:   06/27/12 1350  BP: 120/90  Pulse: 72  Temp: 98.3 F (36.8 C)    Body mass index is 25.91 kg/(m^2).  GENERAL: vitals reviewed and listed above, alert, oriented, appears  well hydrated and in no acute distress  HEENT: atraumatic, conjunttiva clear, no obvious abnormalities on inspection of external nose and ears, normal appearance of ear canals and TMs, clear nasal congestion, mild post oropharyngeal erythema with PND, no tonsillar edema or exudate, no sinus TTP  NECK: no obvious masses on inspection  LUNGS: clear to auscultation bilaterally, no wheezes, rales or rhonchi, good air movement  CV: HRRR, no peripheral edema  MS: moves all extremities without noticeable abnormality  PSYCH: pleasant and cooperative, no obvious depression or anxiety  ASSESSMENT AND PLAN:  Discussed the following assessment and plan:  Acute upper respiratory infections of unspecified site - Plan: albuterol (PROVENTIL HFA;VENTOLIN HFA) 108 (90 BASE) MCG/ACT inhaler, azithromycin (ZITHROMAX) 250 MG tablet  -discussed that this is likely a viral infection, lungs CTA and comfortable on exam. Alb for ? Viral bronchitis or hx of RAD with infections. Abx to have on hand if worsens or does not improve as expected - risks discussed. -Patient advised to return or notify a doctor immediately if symptoms worsen or persist or new concerns arise.  Patient Instructions  INSTRUCTIONS FOR UPPER RESPIRATORY INFECTION:  -plenty of rest and fluids  -nasal saline wash 2-3 times daily (use prepackaged nasal saline or bottled/distilled water if making your own)   -can use tylenol or ibuprofen as directed for aches and sorethroat  -in  the winter time, using a humidifier at night is helpful (please follow cleaning instructions)  -if you are taking a cough medication - use only as directed, may also try a teaspoon of honey to coat the throat and throat lozenges  -for sore throat, salt water gargles can help  -if not improving in 1 week or worsening start antibiotic  -follow up if you have fevers, facial pain, tooth pain, difficulty breathing or not getting better see a physician      Kriste Basque R.

## 2012-06-27 NOTE — Patient Instructions (Addendum)
INSTRUCTIONS FOR UPPER RESPIRATORY INFECTION:  -plenty of rest and fluids  -nasal saline wash 2-3 times daily (use prepackaged nasal saline or bottled/distilled water if making your own)   -can use tylenol or ibuprofen as directed for aches and sorethroat  -in the winter time, using a humidifier at night is helpful (please follow cleaning instructions)  -if you are taking a cough medication - use only as directed, may also try a teaspoon of honey to coat the throat and throat lozenges  -for sore throat, salt water gargles can help  -if not improving in 1 week or worsening start antibiotic  -follow up if you have fevers, facial pain, tooth pain, difficulty breathing or not getting better see a physician

## 2012-11-14 ENCOUNTER — Telehealth: Payer: Self-pay | Admitting: *Deleted

## 2012-11-14 MED ORDER — ESCITALOPRAM OXALATE 10 MG PO TABS: 10.0000 mg | ORAL_TABLET | Freq: Every day | ORAL | Status: DC

## 2012-11-14 NOTE — Telephone Encounter (Signed)
rx sent in electronically to [facility] 

## 2012-11-14 NOTE — Telephone Encounter (Signed)
Refill for 6 months. Consider office followup for complete physical within the next 6 months

## 2012-11-14 NOTE — Telephone Encounter (Signed)
Fax request for refill on Lexapro, generic from [facility] Last filled - 01.06.14, #[id number] to [facility] [local] Last AEX - 07.15.2013 Please advise on request from mail order/SLS

## 2012-11-21 ENCOUNTER — Encounter: Payer: Self-pay | Admitting: Family Medicine

## 2012-11-21 ENCOUNTER — Ambulatory Visit (INDEPENDENT_AMBULATORY_CARE_PROVIDER_SITE_OTHER): Payer: BC Managed Care – PPO | Admitting: Family Medicine

## 2012-11-21 VITALS — BP 124/66 | HR 76 | Temp 97.6°F | Ht 67.0 in | Wt 167.0 lb

## 2012-11-21 DIAGNOSIS — Z0000 Encounter for general adult medical examination without abnormal findings: Secondary | ICD-10-CM

## 2012-11-21 NOTE — Progress Notes (Signed)
  Subjective:    Patient ID: Curtis Rodriguez, male    DOB: 11-08-1965, 47 y.o.   MRN: [id number]  HPI Patient seen for complete physical. He had recent work screen with glucose 123, HDL 36 and total cholesterol 216. No LDL obtained. He is not aware of any other labs. He is trying to get citizenship here in the [place]. He thinks he had tetanus within the past 5 years but does not have records today.  He had previous colonoscopy about 5 years ago reportedly normal  He started exercising more consistently in recent weeks generally runs about 3 days per week and sometimes swims as well Nonsmoker  Past Medical History  Diagnosis Date  . HYPERTHYROIDISM 11/14/2009  . HYPERLIPIDEMIA 01/15/2009  . Anxiety state, unspecified 01/25/2009  . LOW BLOOD PRESSURE 11/07/2009  . 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 FATIGUE 11/07/2009  . PREDIABETES 01/25/2009  . BACK PAIN, UPPER 06/04/2010   No past surgical history on file.  reports that he quit smoking about 5 years ago. His smoking use included Cigarettes. He has a 17.6 pack-year smoking history. He does not have any smokeless tobacco history on file. His alcohol and drug histories are not on file. family history includes Diabetes in his mother and Hypertension in his mother. No Known Allergies    Review of Systems  Constitutional: Negative for fever, activity change, appetite change, fatigue and unexpected weight change.  HENT: Negative for ear pain, congestion and trouble swallowing.   Eyes: Negative for pain and visual disturbance.  Respiratory: Negative for cough, shortness of breath and wheezing.   Cardiovascular: Negative for chest pain and palpitations.  Gastrointestinal: Negative for nausea, vomiting, abdominal pain, diarrhea, constipation, blood in stool, abdominal distention and rectal pain.  Endocrine: Negative for polydipsia and polyuria.  Genitourinary: Negative for dysuria, hematuria and testicular pain.  Musculoskeletal: Negative for joint  swelling and arthralgias.  Skin: Negative for rash.  Neurological: Negative for dizziness, syncope and headaches.  Hematological: Negative for adenopathy.  Psychiatric/Behavioral: Negative for confusion and dysphoric mood.       Objective:   Physical Exam  Constitutional: He is oriented to person, place, and time. He appears well-developed and well-nourished. No distress.  HENT:  Head: Normocephalic and atraumatic.  Right Ear: External ear normal.  Left Ear: External ear normal.  Mouth/Throat: Oropharynx is clear and moist.  Eyes: Conjunctivae and EOM are normal. Pupils are equal, round, and reactive to light.  Neck: Normal range of motion. Neck supple. No thyromegaly present.  Cardiovascular: Normal rate, regular rhythm and normal heart sounds.   No murmur heard. Pulmonary/Chest: No respiratory distress. He has no wheezes. He has no rales.  Abdominal: Soft. Bowel sounds are normal. He exhibits no distension and no mass. There is no tenderness. There is no rebound and no guarding.  Musculoskeletal: He exhibits no edema.  Lymphadenopathy:    He has no cervical adenopathy.  Neurological: He is alert and oriented to person, place, and time. He displays normal reflexes. No cranial nerve deficit.  Skin: No rash noted.  Psychiatric: He has a normal mood and affect.          Assessment & Plan:  Complete physical. Patient will confirm last tetanus. Future labs scheduled. Patient requesting vitamin D which has been low in the past. Had low energy levels and is requesting total testosterone  screen.

## 2012-11-22 ENCOUNTER — Telehealth: Payer: Self-pay | Admitting: Family Medicine

## 2012-11-22 NOTE — Telephone Encounter (Signed)
Pt was given a varicella vaccine on 06-04-2010. Pt is requesting the following info dose,site,route,manufacture and lot #. Please call pt when info is ready for pick up

## 2012-11-22 NOTE — Telephone Encounter (Signed)
Pt form is at the front

## 2012-11-25 ENCOUNTER — Other Ambulatory Visit: Payer: BC Managed Care – PPO

## 2012-12-01 ENCOUNTER — Other Ambulatory Visit (INDEPENDENT_AMBULATORY_CARE_PROVIDER_SITE_OTHER): Payer: BC Managed Care – PPO

## 2012-12-01 DIAGNOSIS — R7989 Other specified abnormal findings of blood chemistry: Secondary | ICD-10-CM

## 2012-12-01 DIAGNOSIS — Z0000 Encounter for general adult medical examination without abnormal findings: Secondary | ICD-10-CM

## 2012-12-01 LAB — BASIC METABOLIC PANEL
CO2: 27 mEq/L (ref 19–32)
Calcium: 9.2 mg/dL (ref 8.4–10.5)
Glucose, Bld: 93 mg/dL (ref 70–99)
Potassium: 4.3 mEq/L (ref 3.5–5.1)
Sodium: 139 mEq/L (ref 135–145)

## 2012-12-01 LAB — CBC WITH DIFFERENTIAL/PLATELET
Eosinophils Absolute: 0.7 10*3/uL (ref 0.0–0.7)
Eosinophils Relative: 12.7 % — ABNORMAL HIGH (ref 0.0–5.0)
Lymphocytes Relative: 36.9 % (ref 12.0–46.0)
MCHC: 33.7 g/dL (ref 30.0–36.0)
MCV: 88.9 fl (ref 78.0–100.0)
Monocytes Absolute: 0.4 10*3/uL (ref 0.1–1.0)
Neutrophils Relative %: 42.6 % — ABNORMAL LOW (ref 43.0–77.0)
Platelets: 172 10*3/uL (ref 150.0–400.0)
WBC: 5.2 10*3/uL (ref 4.5–10.5)

## 2012-12-01 LAB — TSH: TSH: 1.56 u[IU]/mL (ref 0.35–5.50)

## 2012-12-01 LAB — LIPID PANEL
HDL: 38 mg/dL — ABNORMAL LOW (ref >39.00)
Triglycerides: 125 mg/dL (ref 0.0–149.0)
VLDL: 25 mg/dL (ref 0.0–40.0)

## 2012-12-01 LAB — HEPATIC FUNCTION PANEL
Bilirubin, Direct: 0 mg/dL (ref 0.0–0.3)
Total Bilirubin: 0.5 mg/dL (ref 0.3–1.2)

## 2012-12-01 LAB — HEMOGLOBIN A1C: Hgb A1c MFr Bld: 6.2 % (ref 4.6–6.5)

## 2013-03-16 ENCOUNTER — Other Ambulatory Visit: Payer: Self-pay

## 2014-01-29 ENCOUNTER — Other Ambulatory Visit: Payer: BC Managed Care – PPO

## 2014-01-30 ENCOUNTER — Other Ambulatory Visit (INDEPENDENT_AMBULATORY_CARE_PROVIDER_SITE_OTHER): Payer: BC Managed Care – PPO

## 2014-01-30 DIAGNOSIS — Z0000 Encounter for general adult medical examination without abnormal findings: Secondary | ICD-10-CM

## 2014-01-30 LAB — CBC WITH DIFFERENTIAL/PLATELET
BASOS PCT: 0.4 % (ref 0.0–3.0)
Basophils Absolute: 0 10*3/uL (ref 0.0–0.1)
EOS ABS: 0.4 10*3/uL (ref 0.0–0.7)
EOS PCT: 7.8 % — AB (ref 0.0–5.0)
HCT: 40.3 % (ref 39.0–52.0)
HEMOGLOBIN: 13.3 g/dL (ref 13.0–17.0)
LYMPHS PCT: 40.3 % (ref 12.0–46.0)
Lymphs Abs: 1.9 10*3/uL (ref 0.7–4.0)
MCHC: 33.1 g/dL (ref 30.0–36.0)
MCV: 86.3 fl (ref 78.0–100.0)
MONO ABS: 0.4 10*3/uL (ref 0.1–1.0)
Monocytes Relative: 8.2 % (ref 3.0–12.0)
NEUTROS ABS: 2 10*3/uL (ref 1.4–7.7)
Neutrophils Relative %: 43.3 % (ref 43.0–77.0)
Platelets: 204 10*3/uL (ref 150.0–400.0)
RBC: 4.67 Mil/uL (ref 4.22–5.81)
RDW: 14.1 % (ref 11.5–15.5)
WBC: 4.7 10*3/uL (ref 4.0–10.5)

## 2014-01-30 LAB — HEPATIC FUNCTION PANEL
ALBUMIN: 4.2 g/dL (ref 3.5–5.2)
ALK PHOS: 45 U/L (ref 39–117)
ALT: 26 U/L (ref 0–53)
AST: 27 U/L (ref 0–37)
Bilirubin, Direct: 0.1 mg/dL (ref 0.0–0.3)
TOTAL PROTEIN: 7.7 g/dL (ref 6.0–8.3)
Total Bilirubin: 0.5 mg/dL (ref 0.2–1.2)

## 2014-01-30 LAB — POCT URINALYSIS DIPSTICK
Bilirubin, UA: NEGATIVE
Glucose, UA: NEGATIVE
KETONES UA: NEGATIVE
Leukocytes, UA: NEGATIVE
Nitrite, UA: NEGATIVE
PH UA: 8
PROTEIN UA: NEGATIVE
RBC UA: NEGATIVE
SPEC GRAV UA: 1.015
UROBILINOGEN UA: 0.2

## 2014-01-30 LAB — BASIC METABOLIC PANEL
BUN: 8 mg/dL (ref 6–23)
CHLORIDE: 102 meq/L (ref 96–112)
CO2: 28 meq/L (ref 19–32)
Calcium: 9.2 mg/dL (ref 8.4–10.5)
Creatinine, Ser: 1.1 mg/dL (ref 0.4–1.5)
GFR: 79.84 mL/min (ref >60.00)
Glucose, Bld: 90 mg/dL (ref 70–99)
Potassium: 4.1 mEq/L (ref 3.5–5.1)
SODIUM: 137 meq/L (ref 135–145)

## 2014-01-30 LAB — LIPID PANEL
CHOL/HDL RATIO: 4
CHOLESTEROL: 200 mg/dL (ref 0–200)
HDL: 44.7 mg/dL (ref >39.00)
LDL CALC: 132 mg/dL — AB (ref 0–99)
NONHDL: 155.3
Triglycerides: 117 mg/dL (ref 0.0–149.0)
VLDL: 23.4 mg/dL (ref 0.0–40.0)

## 2014-01-30 LAB — TSH: TSH: 2.49 u[IU]/mL (ref 0.35–4.50)

## 2014-02-01 ENCOUNTER — Encounter: Payer: Self-pay | Admitting: Family Medicine

## 2014-02-01 ENCOUNTER — Ambulatory Visit (INDEPENDENT_AMBULATORY_CARE_PROVIDER_SITE_OTHER): Payer: BC Managed Care – PPO | Admitting: Family Medicine

## 2014-02-01 VITALS — BP 120/80 | HR 70 | Temp 97.6°F | Ht 67.0 in | Wt 170.0 lb

## 2014-02-01 DIAGNOSIS — Z0000 Encounter for general adult medical examination without abnormal findings: Secondary | ICD-10-CM

## 2014-02-01 DIAGNOSIS — E785 Hyperlipidemia, unspecified: Secondary | ICD-10-CM

## 2014-02-01 NOTE — Progress Notes (Signed)
   Subjective:    Patient ID: Curtis Rodriguez, male    DOB: 06/02/1965, 48 y.o.   MRN: [id number]  HPI  Complete physical. He is engaged in running program and plans to run his first marathon in November. He has worked up to 20 miles for his long-run per week. It is going fairly. He has occasional bilateral knee pains.  He's had stressful year. Currently separated from wife. Increased job stress. Takes no regular medications. Last tetanus reportedly 6-7 years ago or less.  He will confirm date. History of mild hyperlipidemia not treated with medication. He requests returning next week for flu vaccine  Past Medical History  Diagnosis Date  . HYPERTHYROIDISM 11/14/2009  . HYPERLIPIDEMIA 01/15/2009  . Anxiety state, unspecified 01/25/2009  . LOW BLOOD PRESSURE 11/07/2009  . 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 FATIGUE 11/07/2009  . PREDIABETES 01/25/2009  . BACK PAIN, UPPER 06/04/2010   No past surgical history on file.  reports that he quit smoking about 7 years ago. His smoking use included Cigarettes. He has a 17.6 pack-year smoking history. He does not have any smokeless tobacco history on file. His alcohol and drug histories are not on file. family history includes Diabetes in his mother; Hypertension in his mother. No Known Allergies    Review of Systems  Constitutional: Negative for fever, activity change, appetite change and fatigue.  HENT: Negative for congestion, ear pain and trouble swallowing.   Eyes: Negative for pain and visual disturbance.  Respiratory: Negative for cough, shortness of breath and wheezing.   Cardiovascular: Negative for chest pain and palpitations.  Gastrointestinal: Negative for nausea, vomiting, abdominal pain, diarrhea, constipation, blood in stool, abdominal distention and rectal pain.  Genitourinary: Negative for dysuria, hematuria and testicular pain.  Musculoskeletal: Negative for arthralgias and joint swelling.  Skin: Negative for rash.  Neurological:  Negative for dizziness, syncope and headaches.  Hematological: Negative for adenopathy.  Psychiatric/Behavioral: Negative for confusion and dysphoric mood.       Objective:   Physical Exam  Constitutional: He is oriented to person, place, and time. He appears well-developed and well-nourished. No distress.  HENT:  Head: Normocephalic and atraumatic.  Right Ear: External ear normal.  Left Ear: External ear normal.  Mouth/Throat: Oropharynx is clear and moist.  Eyes: Conjunctivae and EOM are normal. Pupils are equal, round, and reactive to light.  Neck: Normal range of motion. Neck supple. No thyromegaly present.  Cardiovascular: Normal rate, regular rhythm and normal heart sounds.   No murmur heard. Pulmonary/Chest: No respiratory distress. He has no wheezes. He has no rales.  Abdominal: Soft. Bowel sounds are normal. He exhibits no distension and no mass. There is no tenderness. There is no rebound and no guarding.  Musculoskeletal: He exhibits no edema.  Lymphadenopathy:    He has no cervical adenopathy.  Neurological: He is alert and oriented to person, place, and time. He displays normal reflexes. No cranial nerve deficit.  Skin: No rash noted.  Psychiatric: He has a normal mood and affect.          Assessment & Plan:  Complete physical. Confirm date of last tetanus. He will return next week for flu vaccine-per his request. Labs reviewed. Lipids are somewhat improved. Patient requests repeat lipid panel in about 2-1/2 months to reassess after further lifestyle intervention.

## 2014-02-01 NOTE — Progress Notes (Signed)
Pre visit review using our clinic review tool, if applicable. No additional management support is needed unless otherwise documented below in the visit note. 

## 2014-02-05 ENCOUNTER — Ambulatory Visit (INDEPENDENT_AMBULATORY_CARE_PROVIDER_SITE_OTHER): Payer: BC Managed Care – PPO | Admitting: Family Medicine

## 2014-02-05 DIAGNOSIS — Z23 Encounter for immunization: Secondary | ICD-10-CM

## 2014-04-11 ENCOUNTER — Other Ambulatory Visit: Payer: BC Managed Care – PPO

## 2014-05-03 ENCOUNTER — Other Ambulatory Visit (INDEPENDENT_AMBULATORY_CARE_PROVIDER_SITE_OTHER): Payer: BC Managed Care – PPO

## 2014-05-03 DIAGNOSIS — E785 Hyperlipidemia, unspecified: Secondary | ICD-10-CM

## 2014-05-03 LAB — LIPID PANEL
CHOLESTEROL: 190 mg/dL (ref 0–200)
HDL: 47 mg/dL (ref >39.00)
LDL Cholesterol: 124 mg/dL — ABNORMAL HIGH (ref 0–99)
NonHDL: 143
TRIGLYCERIDES: 96 mg/dL (ref 0.0–149.0)
Total CHOL/HDL Ratio: 4
VLDL: 19.2 mg/dL (ref 0.0–40.0)

## 2014-05-07 ENCOUNTER — Encounter: Payer: Self-pay | Admitting: Family Medicine

## 2014-05-07 ENCOUNTER — Ambulatory Visit (INDEPENDENT_AMBULATORY_CARE_PROVIDER_SITE_OTHER): Payer: BC Managed Care – PPO | Admitting: Family Medicine

## 2014-05-07 VITALS — BP 118/76 | HR 65 | Temp 98.3°F | Ht 67.0 in | Wt 164.9 lb

## 2014-05-07 DIAGNOSIS — R05 Cough: Secondary | ICD-10-CM

## 2014-05-07 DIAGNOSIS — J4530 Mild persistent asthma, uncomplicated: Secondary | ICD-10-CM

## 2014-05-07 DIAGNOSIS — R059 Cough, unspecified: Secondary | ICD-10-CM

## 2014-05-07 DIAGNOSIS — J069 Acute upper respiratory infection, unspecified: Secondary | ICD-10-CM

## 2014-05-07 MED ORDER — PREDNISONE 20 MG PO TABS: 40.0000 mg | ORAL_TABLET | Freq: Every day | ORAL | Status: DC

## 2014-05-07 MED ORDER — ALBUTEROL SULFATE HFA 108 (90 BASE) MCG/ACT IN AERS: 2.0000 | INHALATION_SPRAY | Freq: Four times a day (QID) | RESPIRATORY_TRACT | Status: DC | PRN

## 2014-05-07 NOTE — Progress Notes (Signed)
HPI:  -started: 2 weeks ago -symptoms:nasal congestion, sore throat, cough  somewhat better but the cough persists, productive and some wheezing -denies:fever, SOB, NVD, tooth pain -has tried: augmentin - did not quite finish, now on z pack from [facility], prednisone taper for 5 days (2 weeks ago but he did not complete and missed doses) per his report -sick contacts/travel/risks: denies flu exposure, tick exposure or or Ebola risks -Hx of: reactive airway  - used inhaler in the past with this type of issue and it helped ROS: See pertinent positives and negatives per HPI.  Past Medical History  Diagnosis Date  . HYPERTHYROIDISM 11/14/2009  . HYPERLIPIDEMIA 01/15/2009  . Anxiety state, unspecified 01/25/2009  . LOW BLOOD PRESSURE 11/07/2009  . 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 FATIGUE 11/07/2009  . PREDIABETES 01/25/2009  . BACK PAIN, UPPER 06/04/2010    No past surgical history on file.  Family History  Problem Relation Age of Onset  . Diabetes Mother   . Hypertension Mother     History   Social History  . Marital Status: Married    Spouse Name: N/A    Number of Children: N/A  . Years of Education: N/A   Social History Main Topics  . Smoking status: Former Smoker -- 0.80 packs/day for 22 years    Types: Cigarettes    Quit date: 12/31/2006  . Smokeless tobacco: None  . Alcohol Use: None  . Drug Use: None  . Sexual Activity: None   Other Topics Concern  . None   Social History Narrative    Current outpatient prescriptions: azithromycin (ZITHROMAX) 250 MG tablet, Take by mouth daily., Disp: , Rfl: ;  albuterol (PROVENTIL HFA;VENTOLIN HFA) 108 (90 BASE) MCG/ACT inhaler, Inhale 2 puffs into the lungs every 6 (six) hours as needed., Disp: 1 Inhaler, Rfl: 0;  predniSONE (DELTASONE) 20 MG tablet, Take 2 tablets (40 mg total) by mouth daily with breakfast., Disp: 10 tablet, Rfl: 0  EXAM:  Filed Vitals:   05/07/14 1440  BP: 118/76  Pulse: 65  Temp: 98.3 F (36.8 C)     Body mass index is 25.82 kg/(m^2).  GENERAL: vitals reviewed and listed above, alert, oriented, appears well hydrated and in no acute distress  HEENT: atraumatic, conjunttiva clear, no obvious abnormalities on inspection of external nose and ears, normal appearance of ear canals and TMs, clear nasal congestion, mild post oropharyngeal erythema with PND, no tonsillar edema or exudate, no sinus TTP  NECK: no obvious masses on inspection  LUNGS: few  Scattered wheezes  CV: HRRR, no peripheral edema  MS: moves all extremities without noticeable abnormality  PSYCH: pleasant and cooperative, no obvious depression or anxiety  ASSESSMENT AND PLAN:  Discussed the following assessment and plan:  Acute upper respiratory infection - Plan: predniSONE (DELTASONE) 20 MG tablet  Cough - Plan: albuterol (PROVENTIL HFA;VENTOLIN HFA) 108 (90 BASE) MCG/ACT inhaler  Asthma, mild persistent, uncomplicated - Plan: predniSONE (DELTASONE) 20 MG tablet  -given HPI and exam findings today, a serious infection or illness is unlikely. We discussed potential etiologies, with VURI being most likely and possible asthma  -We discussed treatment side effects, likely course, antibiotic misuse, transmission, and signs of developing a serious illness. -opted for completion of abx he is currently taking, prednisone burst given wheezing and ? Hx asthma, alb prn - advise cxr but he is leaving town and is running late and he opted to hold on this -follow up in 2-3 weeks to ensure resolution -of course,  we advised to return or notify a doctor immediately if symptoms worsen or persist or new concerns arise.    There are no Patient Instructions on file for this visit.   Colin Benton R.

## 2014-05-07 NOTE — Patient Instructions (Signed)
Take 40 mg prednisone daily for 5 days  Complete the Azithromycin  Stop the other medications  Use the albuterol as needed  Seek care immediately if worsening or trouble breathing  Follow up with Dr. Elease Hashimoto in 2-3 weeks

## 2014-05-07 NOTE — Progress Notes (Signed)
Pre visit review using our clinic review tool, if applicable. No additional management support is needed unless otherwise documented below in the visit note. 

## 2014-05-15 ENCOUNTER — Ambulatory Visit (INDEPENDENT_AMBULATORY_CARE_PROVIDER_SITE_OTHER): Payer: BLUE CROSS/BLUE SHIELD | Admitting: Psychology

## 2014-05-15 DIAGNOSIS — F4321 Adjustment disorder with depressed mood: Secondary | ICD-10-CM

## 2014-05-21 ENCOUNTER — Encounter: Payer: Self-pay | Admitting: Family Medicine

## 2014-05-21 ENCOUNTER — Ambulatory Visit (INDEPENDENT_AMBULATORY_CARE_PROVIDER_SITE_OTHER): Payer: BLUE CROSS/BLUE SHIELD | Admitting: Family Medicine

## 2014-05-21 VITALS — BP 118/70 | HR 64 | Temp 98.0°F | Wt 164.0 lb

## 2014-05-21 DIAGNOSIS — R05 Cough: Secondary | ICD-10-CM

## 2014-05-21 DIAGNOSIS — R059 Cough, unspecified: Secondary | ICD-10-CM

## 2014-05-21 NOTE — Patient Instructions (Signed)
Cough, Adult  A cough is a reflex that helps clear your throat and airways. It can help heal the body or may be a reaction to an irritated airway. A cough may only last 2 or 3 weeks (acute) or may last more than 8 weeks (chronic).  CAUSES Acute cough:  Viral or bacterial infections. Chronic cough:  Infections.  Allergies.  Asthma.  Post-nasal drip.  Smoking.  Heartburn or acid reflux.  Some medicines.  Chronic lung problems (COPD).  Cancer. SYMPTOMS   Cough.  Fever.  Chest pain.  Increased breathing rate.  High-pitched whistling sound when breathing (wheezing).  Colored mucus that you cough up (sputum). TREATMENT   A bacterial cough may be treated with antibiotic medicine.  A viral cough must run its course and will not respond to antibiotics.  Your caregiver may recommend other treatments if you have a chronic cough. HOME CARE INSTRUCTIONS   Only take over-the-counter or prescription medicines for pain, discomfort, or fever as directed by your caregiver. Use cough suppressants only as directed by your caregiver.  Use a cold steam vaporizer or humidifier in your bedroom or home to help loosen secretions.  Sleep in a semi-upright position if your cough is worse at night.  Rest as needed.  Stop smoking if you smoke. SEEK IMMEDIATE MEDICAL CARE IF:   You have pus in your sputum.  Your cough starts to worsen.  You cannot control your cough with suppressants and are losing sleep.  You begin coughing up blood.  You have difficulty breathing.  You develop pain which is getting worse or is uncontrolled with medicine.  You have a fever. MAKE SURE YOU:   Understand these instructions.  Will watch your condition.  Will get help right away if you are not doing well or get worse. Document Released: 10/24/2010 Document Revised: 07/20/2011 Document Reviewed: 10/24/2010 Exit[employer] Patient Information 2015 [employer]. This information is not intended  to replace advice given to you by your health care provider. Make sure you discuss any questions you have with your health care provider.  

## 2014-05-21 NOTE — Progress Notes (Signed)
   Subjective:    Patient ID: Curtis Rodriguez, male    DOB: 05-Jan-1966, 49 y.o.   MRN: [id number]  HPI Patient is seen following recent respiratory illness. Onset around mid December. He went to urgent care twice initially treated with Augmentin and subsequently with Zithromax. was also placed on prednisone and given albuterol inhaler. He feels significant improvement at this point. Still has occasional cough but no fevers or chills. He actually ran 11 mile Saturday without difficulty. His rare cough is mostly nonproductive. He has taken occasional Mucinex. No dyspnea. Overall energy levels are improving.   Past Medical History  Diagnosis Date  . HYPERTHYROIDISM 11/14/2009  . HYPERLIPIDEMIA 01/15/2009  . Anxiety state, unspecified 01/25/2009  . LOW BLOOD PRESSURE 11/07/2009  . 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 FATIGUE 11/07/2009  . PREDIABETES 01/25/2009  . BACK PAIN, UPPER 06/04/2010   No past surgical history on file.  reports that he quit smoking about 7 years ago. His smoking use included Cigarettes. He has a 17.6 pack-year smoking history. He does not have any smokeless tobacco history on file. His alcohol and drug histories are not on file. family history includes Diabetes in his mother; Hypertension in his mother. No Known Allergies    Review of Systems  Constitutional: Negative for fever and chills.  HENT: Negative for postnasal drip.   Respiratory: Positive for cough. Negative for shortness of breath.   Cardiovascular: Negative for chest pain.       Objective:   Physical Exam  Constitutional: He appears well-developed and well-nourished. No distress.  HENT:  Right Ear: External ear normal.  Left Ear: External ear normal.  Mouth/Throat: Oropharynx is clear and moist.  Cardiovascular: Normal rate and regular rhythm.   Pulmonary/Chest: Effort normal.  He has couple of faint expiratory wheezes which seemed to clear with deep breathing          Assessment & Plan:  Cough.  Suspect recent viral process. Gradually improving. He did apparently have some reactive airway component. Consider steroid inhaler if cough persists but at this point this is improving. Follow-up as needed

## 2014-05-21 NOTE — Progress Notes (Signed)
Pre visit review using our clinic review tool, if applicable. No additional management support is needed unless otherwise documented below in the visit note. 

## 2014-06-15 ENCOUNTER — Ambulatory Visit: Payer: BLUE CROSS/BLUE SHIELD | Admitting: Psychology

## 2014-11-22 ENCOUNTER — Ambulatory Visit (INDEPENDENT_AMBULATORY_CARE_PROVIDER_SITE_OTHER): Payer: BLUE CROSS/BLUE SHIELD | Admitting: Family Medicine

## 2014-11-22 DIAGNOSIS — Z113 Encounter for screening for infections with a predominantly sexual mode of transmission: Secondary | ICD-10-CM

## 2014-11-22 DIAGNOSIS — E785 Hyperlipidemia, unspecified: Secondary | ICD-10-CM | POA: Diagnosis not present

## 2014-11-22 MED ORDER — ESCITALOPRAM OXALATE 20 MG PO TABS: 20.0000 mg | ORAL_TABLET | Freq: Every day | ORAL | Status: DC

## 2014-11-22 NOTE — Progress Notes (Signed)
   Subjective:    Patient ID: Curtis Rodriguez, male    DOB: 12-Jun-1965, 49 y.o.   MRN: [id number]  HPI Patient here to discuss recent labs through his work. He has history of hyperlipidemia and mild hyperglycemia. Over year ago he really turned his attention to his health and started running regularly and making some serious dietary changes. He has reduced processed foods as well as sugars and starches. Recent labs at work revealed cholesterol 155, HDL 51 and glucose 104. LDL was not included. Nonsmoker. He's been running regularly about 25-30 miles per week  Patient generally gets physical in the fall but he is requesting getting repeat fasting labs here early next week. No history of CAD or peripheral vascular disease. He underwent separation during the past year. He has a new girlfriend. Requesting STD screening. No history of STDs in the past and asymptomatic.  Past Medical History  Diagnosis Date  . HYPERTHYROIDISM 11/14/2009  . HYPERLIPIDEMIA 01/15/2009  . Anxiety state, unspecified 01/25/2009  . LOW BLOOD PRESSURE 11/07/2009  . 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 FATIGUE 11/07/2009  . PREDIABETES 01/25/2009  . BACK PAIN, UPPER 06/04/2010   No past surgical history on file.  reports that he quit smoking about 7 years ago. His smoking use included Cigarettes. He has a 17.6 pack-year smoking history. He does not have any smokeless tobacco history on file. His alcohol and drug histories are not on file. family history includes Diabetes in his mother; Hypertension in his mother. No Known Allergies     Review of Systems  Constitutional: Negative for appetite change, fatigue and unexpected weight change.  Eyes: Negative for visual disturbance.  Respiratory: Negative for cough, chest tightness and shortness of breath.   Cardiovascular: Negative for chest pain, palpitations and leg swelling.  Genitourinary: Negative for dysuria.  Skin: Negative for rash.  Neurological: Negative for dizziness,  syncope, weakness, light-headedness and headaches.       Objective:   Physical Exam  Constitutional: He appears well-developed and well-nourished.  Cardiovascular: Normal rate and regular rhythm.   Pulmonary/Chest: Effort normal and breath sounds normal. No respiratory distress. He has no wheezes. He has no rales.  Musculoskeletal: He exhibits no edema.          Assessment & Plan:  #1 history of hyperlipidemia. Lipids greatly improved by recent labs through his work. Requesting repeat fasting and these will be ordered. #2 STD screening. Ordered urine for GC and Chlamydia as well as RPR and HIV. He thinks he has had previous hepatitis B vaccination series

## 2014-11-26 ENCOUNTER — Other Ambulatory Visit: Payer: BLUE CROSS/BLUE SHIELD

## 2014-11-27 ENCOUNTER — Other Ambulatory Visit (INDEPENDENT_AMBULATORY_CARE_PROVIDER_SITE_OTHER): Payer: BLUE CROSS/BLUE SHIELD

## 2014-11-27 DIAGNOSIS — E785 Hyperlipidemia, unspecified: Secondary | ICD-10-CM

## 2014-11-27 DIAGNOSIS — Z113 Encounter for screening for infections with a predominantly sexual mode of transmission: Secondary | ICD-10-CM

## 2014-11-27 LAB — LIPID PANEL
Cholesterol: 194 mg/dL (ref 0–200)
HDL: 46.7 mg/dL (ref >39.00)
LDL CALC: 131 mg/dL — AB (ref 0–99)
NONHDL: 147.3
TRIGLYCERIDES: 82 mg/dL (ref 0.0–149.0)
Total CHOL/HDL Ratio: 4
VLDL: 16.4 mg/dL (ref 0.0–40.0)

## 2014-11-27 LAB — HEPATIC FUNCTION PANEL
ALT: 19 U/L (ref 0–53)
AST: 19 U/L (ref 0–37)
Albumin: 4.1 g/dL (ref 3.5–5.2)
Alkaline Phosphatase: 31 U/L — ABNORMAL LOW (ref 39–117)
BILIRUBIN DIRECT: 0.1 mg/dL (ref 0.0–0.3)
TOTAL PROTEIN: 6.8 g/dL (ref 6.0–8.3)
Total Bilirubin: 0.4 mg/dL (ref 0.2–1.2)

## 2014-11-28 LAB — GC/CHLAMYDIA PROBE AMP, URINE
Chlamydia, Swab/Urine, PCR: NEGATIVE
GC PROBE AMP, URINE: NEGATIVE

## 2014-11-28 LAB — HIV ANTIBODY (ROUTINE TESTING W REFLEX): HIV 1&2 Ab, 4th Generation: NONREACTIVE

## 2014-11-28 LAB — RPR

## 2015-09-11 ENCOUNTER — Ambulatory Visit (INDEPENDENT_AMBULATORY_CARE_PROVIDER_SITE_OTHER): Payer: BLUE CROSS/BLUE SHIELD | Admitting: Family Medicine

## 2015-09-11 ENCOUNTER — Other Ambulatory Visit: Payer: Self-pay | Admitting: Family Medicine

## 2015-09-11 VITALS — BP 100/86 | HR 61 | Temp 98.1°F | Ht 67.0 in | Wt 164.0 lb

## 2015-09-11 DIAGNOSIS — E785 Hyperlipidemia, unspecified: Secondary | ICD-10-CM | POA: Diagnosis not present

## 2015-09-11 DIAGNOSIS — Z113 Encounter for screening for infections with a predominantly sexual mode of transmission: Secondary | ICD-10-CM

## 2015-09-11 DIAGNOSIS — F411 Generalized anxiety disorder: Secondary | ICD-10-CM

## 2015-09-11 LAB — LIPID PANEL
Cholesterol: 237 mg/dL — ABNORMAL HIGH (ref 0–200)
HDL: 51.8 mg/dL (ref >39.00)
LDL CALC: 152 mg/dL — AB (ref 0–99)
NONHDL: 185.63
Total CHOL/HDL Ratio: 5
Triglycerides: 169 mg/dL — ABNORMAL HIGH (ref 0.0–149.0)
VLDL: 33.8 mg/dL (ref 0.0–40.0)

## 2015-09-11 NOTE — Progress Notes (Signed)
Pre visit review using our clinic review tool, if applicable. No additional management support is needed unless otherwise documented below in the visit note. 

## 2015-09-11 NOTE — Progress Notes (Signed)
   Subjective:    Patient ID: Curtis Rodriguez, male    DOB: [birth year], 50 y.o.   MRN: [id number]  HPI Patient seen today for the following  Requesting STD screening. He has a steady girlfriend for the past several months. Underwent divorce couple years ago. He's not had any dysuria or rash or other concerning symptoms. No past history of STD. He simply wishes to be screened. Unsure regarding status of hepatitis B vaccination.  History of mild hyperlipidemia. Diligently exercises about 7-8 hours per week. Combination of running, yoga, and resistance training. No recent chest pains with exercise.  Past history of anxiety but only takes Lexapro as needed and infrequently. Denies any depression symptoms.  Past Medical History  Diagnosis Date  . HYPERTHYROIDISM 11/14/2009  . HYPERLIPIDEMIA 01/15/2009  . Anxiety state, unspecified 01/25/2009  . LOW BLOOD PRESSURE 11/07/2009  . 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 FATIGUE 11/07/2009  . PREDIABETES 01/25/2009  . BACK PAIN, UPPER 06/04/2010   No past surgical history on file.  reports that he quit smoking about 8 years ago. His smoking use included Cigarettes. He has a 17.6 pack-year smoking history. He does not have any smokeless tobacco history on file. His alcohol and drug histories are not on file. family history includes Diabetes in his mother; Hypertension in his mother. No Known Allergies    Review of Systems  Constitutional: Negative for fever, chills and fatigue.  Eyes: Negative for visual disturbance.  Respiratory: Negative for cough, chest tightness and shortness of breath.   Cardiovascular: Negative for chest pain, palpitations and leg swelling.  Gastrointestinal: Negative for abdominal pain.  Genitourinary: Negative for dysuria, frequency, discharge, genital sores and testicular pain.  Neurological: Negative for dizziness, syncope, weakness, light-headedness and headaches.  Hematological: Negative for adenopathy.         Objective:   Physical Exam  Constitutional: He appears well-developed and well-nourished. No distress.  Neck: Neck supple. No thyromegaly present.  Cardiovascular: Normal rate and regular rhythm.   Pulmonary/Chest: Effort normal and breath sounds normal. No respiratory distress. He has no wheezes. He has no rales.  Musculoskeletal: He exhibits no edema.  Lymphadenopathy:    He has no cervical adenopathy.  Psychiatric: He has a normal mood and affect. His behavior is normal. Judgment and thought content normal.          Assessment & Plan:  #1 STD screening. Check HIV, RPR, GC and Chlamydia, and hepatitis B surface antigen. He is asymptomatic. We discussed prevention issues.  #2 history of mild hyperlipidemia. Overall low risk for CAD. Quit smoking 2008. Recheck lipids per patient request  #3 past history of anxiety. Infrequent symptoms currently. Currently not taking Lexapro and stable.  [name] MD [facility]

## 2015-09-12 ENCOUNTER — Encounter: Payer: Self-pay | Admitting: Family Medicine

## 2015-09-12 LAB — GC/CHLAMYDIA PROBE AMP
CT Probe RNA: NOT DETECTED
GC PROBE AMP APTIMA: NOT DETECTED

## 2015-09-12 LAB — RPR

## 2015-09-12 LAB — HEPATITIS B SURFACE AG-PRE VC IMM ST: HEPATITIS B PRE SCREEN: NONREACTIVE

## 2015-09-12 LAB — HIV ANTIBODY (ROUTINE TESTING W REFLEX): HIV 1&2 Ab, 4th Generation: NONREACTIVE

## 2016-06-04 DIAGNOSIS — B9789 Other viral agents as the cause of diseases classified elsewhere: Secondary | ICD-10-CM | POA: Diagnosis not present

## 2016-06-04 DIAGNOSIS — J069 Acute upper respiratory infection, unspecified: Secondary | ICD-10-CM | POA: Diagnosis not present

## 2016-06-13 DIAGNOSIS — R69 Illness, unspecified: Secondary | ICD-10-CM | POA: Diagnosis not present

## 2016-06-13 DIAGNOSIS — R5383 Other fatigue: Secondary | ICD-10-CM | POA: Diagnosis not present

## 2016-06-13 DIAGNOSIS — R6889 Other general symptoms and signs: Secondary | ICD-10-CM | POA: Diagnosis not present

## 2016-06-15 ENCOUNTER — Encounter: Payer: Self-pay | Admitting: Family Medicine

## 2016-06-15 ENCOUNTER — Ambulatory Visit (INDEPENDENT_AMBULATORY_CARE_PROVIDER_SITE_OTHER): Payer: BLUE CROSS/BLUE SHIELD | Admitting: Family Medicine

## 2016-06-15 VITALS — BP 100/70 | HR 60 | Temp 97.8°F | Ht 67.0 in | Wt 164.0 lb

## 2016-06-15 DIAGNOSIS — R140 Abdominal distension (gaseous): Secondary | ICD-10-CM | POA: Diagnosis not present

## 2016-06-15 DIAGNOSIS — R5383 Other fatigue: Secondary | ICD-10-CM

## 2016-06-15 DIAGNOSIS — M791 Myalgia, unspecified site: Secondary | ICD-10-CM

## 2016-06-15 LAB — SEDIMENTATION RATE: Sed Rate: 8 mm/hr (ref 0–15)

## 2016-06-15 LAB — CBC WITH DIFFERENTIAL/PLATELET
BASOS ABS: 0 10*3/uL (ref 0.0–0.1)
BASOS PCT: 0.6 % (ref 0.0–3.0)
EOS ABS: 0.2 10*3/uL (ref 0.0–0.7)
Eosinophils Relative: 4.6 % (ref 0.0–5.0)
HCT: 40.8 % (ref 39.0–52.0)
HEMOGLOBIN: 13.7 g/dL (ref 13.0–17.0)
Lymphocytes Relative: 45.1 % (ref 12.0–46.0)
Lymphs Abs: 2.1 10*3/uL (ref 0.7–4.0)
MCHC: 33.5 g/dL (ref 30.0–36.0)
MCV: 87.2 fl (ref 78.0–100.0)
MONO ABS: 0.4 10*3/uL (ref 0.1–1.0)
Monocytes Relative: 8.9 % (ref 3.0–12.0)
Neutro Abs: 1.9 10*3/uL (ref 1.4–7.7)
Neutrophils Relative %: 40.8 % — ABNORMAL LOW (ref 43.0–77.0)
PLATELETS: 192 10*3/uL (ref 150.0–400.0)
RBC: 4.68 Mil/uL (ref 4.22–5.81)
RDW: 13.8 % (ref 11.5–15.5)
WBC: 4.7 10*3/uL (ref 4.0–10.5)

## 2016-06-15 LAB — COMPREHENSIVE METABOLIC PANEL
ALT: 24 U/L (ref 0–53)
AST: 22 U/L (ref 0–37)
Albumin: 4.3 g/dL (ref 3.5–5.2)
Alkaline Phosphatase: 43 U/L (ref 39–117)
BUN: 16 mg/dL (ref 6–23)
CHLORIDE: 102 meq/L (ref 96–112)
CO2: 32 mEq/L (ref 19–32)
Calcium: 9.2 mg/dL (ref 8.4–10.5)
Creatinine, Ser: 0.94 mg/dL (ref 0.40–1.50)
GFR: 90.78 mL/min (ref >60.00)
GLUCOSE: 94 mg/dL (ref 70–99)
POTASSIUM: 4.1 meq/L (ref 3.5–5.1)
SODIUM: 138 meq/L (ref 135–145)
TOTAL PROTEIN: 7.2 g/dL (ref 6.0–8.3)
Total Bilirubin: 0.4 mg/dL (ref 0.2–1.2)

## 2016-06-15 LAB — TSH: TSH: 1.58 u[IU]/mL (ref 0.35–4.50)

## 2016-06-15 NOTE — Progress Notes (Signed)
Pre visit review using our clinic review tool, if applicable. No additional management support is needed unless otherwise documented below in the visit note. 

## 2016-06-15 NOTE — Progress Notes (Signed)
Subjective:     Patient ID: Curtis Rodriguez, male   DOB: February 14, 1966, 51 y.o.   MRN: [id number]  HPI Patient's seen today with nonspecific symptoms of bloating, fatigue, malaise, and some general myalgias. He states that he visited [place] and was there from December 19 through early January. He then went to [place] for 3 days. He denies being sick at any point during his visit during either one of those countries. He had some mild constipation and bloating but had been taking regular Pepto-Bismol. No diarrhea. Went to urgent care on January 25 with some sore throat symptoms and apparently was prescribed amoxicillin. He reports rapid strep was negative. He returned February 3 to the same urgent care with persistent symptoms of fatigue and states he had flu screen which was negative. He was given Tamiflu anyway. Symptoms are unchanged.  He states his appetite is fair. He has fatigue. He has intermittent abdominal bloating but denies any constipation or diarrhea. No bloody stools. General muscle fatigue and myalgias. No polyarthralgias. No skin rash. Denies any headache. No adenopathy. Denies fever or chills. Sleeping well. Diet unchanged.  Generally healthy. He has history of prediabetes and hyperlipidemia. Takes no regular medications.  Past Medical History:  Diagnosis Date  . Anxiety state, unspecified 01/25/2009  . BACK PAIN, UPPER 06/04/2010  . FATIGUE 11/07/2009  . HYPERLIPIDEMIA 01/15/2009  . HYPERTHYROIDISM 11/14/2009  . LOW BLOOD PRESSURE 11/07/2009  . PREDIABETES 01/25/2009  . REACTIVE AIRWAY DISEASE 03/19/2009   No past surgical history on file.  reports that he quit smoking about 9 years ago. His smoking use included Cigarettes. He has a 17.60 pack-year smoking history. He has never used smokeless tobacco. His alcohol and drug histories are not on file. family history includes Diabetes in his mother; Hypertension in his mother. No Known Allergies   Review of Systems  Constitutional: Positive for  fatigue. Negative for activity change, appetite change and fever.  HENT: Negative for congestion, ear pain, sore throat and trouble swallowing.   Eyes: Negative for pain and visual disturbance.  Respiratory: Negative for cough, shortness of breath and wheezing.   Cardiovascular: Negative for chest pain and palpitations.  Gastrointestinal: Negative for abdominal pain, blood in stool, constipation, diarrhea, nausea, rectal pain and vomiting.  Genitourinary: Negative for dysuria, hematuria and testicular pain.  Musculoskeletal: Positive for myalgias. Negative for arthralgias and joint swelling.  Skin: Negative for rash.  Neurological: Negative for dizziness, syncope and headaches.  Hematological: Negative for adenopathy.  Psychiatric/Behavioral: Negative for confusion and dysphoric mood.       Objective:   Physical Exam  Constitutional: He appears well-developed and well-nourished.  HENT:  Right Ear: External ear normal.  Left Ear: External ear normal.  Mouth/Throat: Oropharynx is clear and moist. No oropharyngeal exudate.  Neck: Neck supple. No thyromegaly present.  Cardiovascular: Normal rate and regular rhythm.   Pulmonary/Chest: Effort normal and breath sounds normal. No respiratory distress. He has no wheezes. He has no rales.  Abdominal: Soft. He exhibits no distension and no mass. There is no tenderness. There is no rebound and no guarding.  Musculoskeletal: He exhibits no edema.  Lymphadenopathy:    He has no cervical adenopathy.  Skin: No rash noted.       Assessment:     Patient presents with very nonspecific symptoms of fatigue, myalgias, abdominal bloating following recent travels as above. He does not have any diarrhea, nausea, vomiting, or other indicators of acute gastrointestinal illness/infection. Nontoxic in appearance. He had questions about  sending off stool studies but since he has no diarrhea whatsoever feel that would be a very low yield    Plan:     -Obtain  screening lab work with comprehensive metabolic panel, CBC, sedimentation rate, TSH -Continue to get adequate sleep -Continue regular exercise habits as tolerated -Follow-up for any new symptoms such as fever, abdominal pain, etc.  [name] MD [facility] at [facility]

## 2016-06-22 ENCOUNTER — Telehealth: Payer: Self-pay | Admitting: Family Medicine

## 2016-06-22 NOTE — Telephone Encounter (Signed)
Pt would like blood work results °

## 2016-06-22 NOTE — Telephone Encounter (Signed)
Pt is requesting

## 2016-06-22 NOTE — Telephone Encounter (Signed)
I sent these through [place]........   I wish patients would check this when they sign up.  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            06/15/16 3:21 PM  [name],   Your labs look good. No indication of serious infection. Thyroid and chemistries were normal. Your sed rate was also low which indicates no serious source of inflammation.  Do not see any reason to restrict your exercise at this time. Hope you are feeling better soon.   Dr B    This MyChart message has not been read.

## 2016-06-23 ENCOUNTER — Other Ambulatory Visit: Payer: Self-pay

## 2016-06-23 NOTE — Telephone Encounter (Signed)
Pt is aware of results. 

## 2016-08-07 DIAGNOSIS — J208 Acute bronchitis due to other specified organisms: Secondary | ICD-10-CM | POA: Diagnosis not present

## 2016-08-10 ENCOUNTER — Telehealth: Payer: Self-pay | Admitting: *Deleted

## 2016-08-10 NOTE — Telephone Encounter (Signed)
Patient is requesting a refill of escitalopram 20 mg (brand name only).  Not on medication list. Last office visit 06/15/16.  Okay to fill?

## 2016-08-10 NOTE — Telephone Encounter (Signed)
I would start at 10 mg once daily.  May start back and office follow up in 3-4 weeks to reassess.  We cannot start SSRI at top/higher dose.

## 2016-08-11 MED ORDER — ESCITALOPRAM OXALATE 10 MG PO TABS: 10.0000 mg | ORAL_TABLET | Freq: Every day | ORAL | 1 refills | Status: DC

## 2016-08-14 DIAGNOSIS — J069 Acute upper respiratory infection, unspecified: Secondary | ICD-10-CM | POA: Diagnosis not present

## 2016-08-14 DIAGNOSIS — J208 Acute bronchitis due to other specified organisms: Secondary | ICD-10-CM | POA: Diagnosis not present

## 2016-08-14 DIAGNOSIS — J9801 Acute bronchospasm: Secondary | ICD-10-CM | POA: Diagnosis not present

## 2016-12-28 ENCOUNTER — Encounter: Payer: Self-pay | Admitting: Family Medicine

## 2016-12-28 ENCOUNTER — Ambulatory Visit (INDEPENDENT_AMBULATORY_CARE_PROVIDER_SITE_OTHER): Payer: BLUE CROSS/BLUE SHIELD | Admitting: Family Medicine

## 2016-12-28 VITALS — BP 102/70 | HR 54 | Temp 98.3°F | Ht 68.0 in | Wt 157.7 lb

## 2016-12-28 DIAGNOSIS — Z23 Encounter for immunization: Secondary | ICD-10-CM | POA: Diagnosis not present

## 2016-12-28 DIAGNOSIS — Z0000 Encounter for general adult medical examination without abnormal findings: Secondary | ICD-10-CM | POA: Diagnosis not present

## 2016-12-28 LAB — CBC WITH DIFFERENTIAL/PLATELET
BASOS PCT: 0.8 % (ref 0.0–3.0)
Basophils Absolute: 0 10*3/uL (ref 0.0–0.1)
EOS ABS: 0.2 10*3/uL (ref 0.0–0.7)
EOS PCT: 4.1 % (ref 0.0–5.0)
HCT: 42.5 % (ref 39.0–52.0)
Hemoglobin: 14.2 g/dL (ref 13.0–17.0)
LYMPHS ABS: 1.7 10*3/uL (ref 0.7–4.0)
Lymphocytes Relative: 39.4 % (ref 12.0–46.0)
MCHC: 33.3 g/dL (ref 30.0–36.0)
MCV: 87.7 fl (ref 78.0–100.0)
MONO ABS: 0.3 10*3/uL (ref 0.1–1.0)
Monocytes Relative: 7.4 % (ref 3.0–12.0)
NEUTROS ABS: 2.1 10*3/uL (ref 1.4–7.7)
NEUTROS PCT: 48.3 % (ref 43.0–77.0)
PLATELETS: 173 10*3/uL (ref 150.0–400.0)
RBC: 4.84 Mil/uL (ref 4.22–5.81)
RDW: 15 % (ref 11.5–15.5)
WBC: 4.3 10*3/uL (ref 4.0–10.5)

## 2016-12-28 LAB — HEPATIC FUNCTION PANEL
ALBUMIN: 4.3 g/dL (ref 3.5–5.2)
ALT: 17 U/L (ref 0–53)
AST: 17 U/L (ref 0–37)
Alkaline Phosphatase: 41 U/L (ref 39–117)
BILIRUBIN TOTAL: 0.7 mg/dL (ref 0.2–1.2)
Bilirubin, Direct: 0.1 mg/dL (ref 0.0–0.3)
Total Protein: 7.6 g/dL (ref 6.0–8.3)

## 2016-12-28 LAB — LIPID PANEL
CHOLESTEROL: 211 mg/dL — AB (ref 0–200)
HDL: 52.6 mg/dL (ref >39.00)
LDL CALC: 143 mg/dL — AB (ref 0–99)
NonHDL: 158.84
Total CHOL/HDL Ratio: 4
Triglycerides: 80 mg/dL (ref 0.0–149.0)
VLDL: 16 mg/dL (ref 0.0–40.0)

## 2016-12-28 LAB — BASIC METABOLIC PANEL
BUN: 11 mg/dL (ref 6–23)
CHLORIDE: 101 meq/L (ref 96–112)
CO2: 30 mEq/L (ref 19–32)
Calcium: 9.6 mg/dL (ref 8.4–10.5)
Creatinine, Ser: 1.07 mg/dL (ref 0.40–1.50)
GFR: 78 mL/min (ref >60.00)
Glucose, Bld: 96 mg/dL (ref 70–99)
POTASSIUM: 4.1 meq/L (ref 3.5–5.1)
SODIUM: 137 meq/L (ref 135–145)

## 2016-12-28 LAB — TSH: TSH: 1.85 u[IU]/mL (ref 0.35–4.50)

## 2016-12-28 NOTE — Progress Notes (Signed)
Subjective:     Patient ID: Curtis Rodriguez, male   DOB: 1966-02-25, 51 y.o.   MRN: [id number]  HPI  Patient seen for physical exam. Generally very healthy. Takes no regular medications. He needs tetanus booster.  Exercises regularly several times for week and also does frequent yoga and meditation. He had very stressful past couple years with divorce about a year and a half ago. His job requires frequent travel.  Past Medical History:  Diagnosis Date  . Anxiety state, unspecified 01/25/2009  . BACK PAIN, UPPER 06/04/2010  . FATIGUE 11/07/2009  . HYPERLIPIDEMIA 01/15/2009  . HYPERTHYROIDISM 11/14/2009  . LOW BLOOD PRESSURE 11/07/2009  . PREDIABETES 01/25/2009  . REACTIVE AIRWAY DISEASE 03/19/2009   No past surgical history on file.  reports that he quit smoking about 10 years ago. His smoking use included Cigarettes. He has a 17.60 pack-year smoking history. He has never used smokeless tobacco. His alcohol and drug histories are not on file. family history includes Diabetes in his mother; Hypertension in his mother. No Known Allergies  Review of Systems  Constitutional: Negative for activity change, appetite change, fatigue and fever.  HENT: Negative for congestion, ear pain and trouble swallowing.   Eyes: Negative for pain and visual disturbance.  Respiratory: Negative for cough, shortness of breath and wheezing.   Cardiovascular: Negative for chest pain and palpitations.  Gastrointestinal: Negative for abdominal distention, abdominal pain, blood in stool, constipation, diarrhea, nausea, rectal pain and vomiting.  Genitourinary: Negative for dysuria, hematuria and testicular pain.  Musculoskeletal: Negative for arthralgias and joint swelling.  Skin: Negative for rash.  Neurological: Negative for dizziness, syncope and headaches.  Hematological: Negative for adenopathy.  Psychiatric/Behavioral: Negative for confusion.       Objective:   Physical Exam  Constitutional: He is oriented to  person, place, and time. He appears well-developed and well-nourished. No distress.  HENT:  Head: Normocephalic and atraumatic.  Right Ear: External ear normal.  Left Ear: External ear normal.  Mouth/Throat: Oropharynx is clear and moist.  Eyes: Pupils are equal, round, and reactive to light. Conjunctivae and EOM are normal.  Neck: Normal range of motion. Neck supple. No thyromegaly present.  Cardiovascular: Normal rate, regular rhythm and normal heart sounds.   No murmur heard. Pulmonary/Chest: No respiratory distress. He has no wheezes. He has no rales.  Abdominal: Soft. Bowel sounds are normal. He exhibits no distension and no mass. There is no tenderness. There is no rebound and no guarding.  Musculoskeletal: He exhibits no edema.  Lymphadenopathy:    He has no cervical adenopathy.  Neurological: He is alert and oriented to person, place, and time. He displays normal reflexes. No cranial nerve deficit.  Skin: No rash noted.  Psychiatric: He has a normal mood and affect.       Assessment:     Physical exam. Healthy 51 year old male. He has history of hyperlipidemia but overall low risk for CAD     Plan:     -Tetanus booster given -Obtain screening lab work -Consider shingles vaccine by next year at age 23 along with colonoscopy and possible PSA screening  [name] MD [facility] at [facility]

## 2016-12-28 NOTE — Addendum Note (Signed)
Addended by: Westley Hummer B on: 12/28/2016 12:55 PM   Modules accepted: Orders

## 2017-01-28 ENCOUNTER — Encounter: Payer: Self-pay | Admitting: Family Medicine

## 2017-02-04 ENCOUNTER — Encounter: Payer: Self-pay | Admitting: Family Medicine

## 2017-02-05 ENCOUNTER — Ambulatory Visit (INDEPENDENT_AMBULATORY_CARE_PROVIDER_SITE_OTHER): Payer: BLUE CROSS/BLUE SHIELD | Admitting: Family Medicine

## 2017-02-05 ENCOUNTER — Encounter: Payer: Self-pay | Admitting: Family Medicine

## 2017-02-05 VITALS — BP 98/64 | HR 52 | Temp 98.6°F | Wt 160.9 lb

## 2017-02-05 DIAGNOSIS — Z23 Encounter for immunization: Secondary | ICD-10-CM | POA: Diagnosis not present

## 2017-02-05 DIAGNOSIS — M546 Pain in thoracic spine: Secondary | ICD-10-CM | POA: Diagnosis not present

## 2017-02-05 DIAGNOSIS — E559 Vitamin D deficiency, unspecified: Secondary | ICD-10-CM

## 2017-02-05 DIAGNOSIS — G8929 Other chronic pain: Secondary | ICD-10-CM | POA: Diagnosis not present

## 2017-02-05 DIAGNOSIS — E785 Hyperlipidemia, unspecified: Secondary | ICD-10-CM | POA: Diagnosis not present

## 2017-02-05 NOTE — Progress Notes (Signed)
Subjective:     Patient ID: Curtis Rodriguez, male   DOB: January 29, 1966, 51 y.o.   MRN: [id number]  HPI Patient here to discuss multiple items  His employer is requiring proof of varicella vaccination. Back in 2012 he had negative Varicella IgG antibody and he received 1 Varicella immunization but has not had any since then. Needs total of 2.  Patient also requesting review of recent labs with physical. These were all basically good with exception of mildly elevated lipids. His 10 year risk of CAD event is 4.6%. No family history of premature CAD. He exercises several hours per week. Nonsmoker.  He's had some chronic neck and upper back pains for several years. Pains occur almost daily. His tried multiple things including massage therapy and physiotherapy without much improvement. She's had previous dry needling. Denies any radiculopathy type symptoms.  Back pain limiting some exercise such as weight lifting.  History of vitamin D deficiency. He has not had lipids checked in several years. Is not currently taking any vitamin D supplement  Past Medical History:  Diagnosis Date  . Anxiety state, unspecified 01/25/2009  . BACK PAIN, UPPER 06/04/2010  . FATIGUE 11/07/2009  . HYPERLIPIDEMIA 01/15/2009  . HYPERTHYROIDISM 11/14/2009  . LOW BLOOD PRESSURE 11/07/2009  . PREDIABETES 01/25/2009  . REACTIVE AIRWAY DISEASE 03/19/2009   No past surgical history on file.  reports that he quit smoking about 10 years ago. His smoking use included Cigarettes. He has a 17.60 pack-year smoking history. He has never used smokeless tobacco. His alcohol and drug histories are not on file. family history includes Diabetes in his mother; Hypertension in his mother. No Known Allergies   Review of Systems  Constitutional: Negative for fatigue.  Eyes: Negative for visual disturbance.  Respiratory: Negative for cough, chest tightness and shortness of breath.   Cardiovascular: Negative for chest pain, palpitations and leg swelling.   Endocrine: Negative for polydipsia and polyuria.  Neurological: Negative for dizziness, syncope, weakness, light-headedness and headaches.       Objective:   Physical Exam  Constitutional: He is oriented to person, place, and time. He appears well-developed and well-nourished.  HENT:  Right Ear: External ear normal.  Left Ear: External ear normal.  Mouth/Throat: Oropharynx is clear and moist.  Eyes: Pupils are equal, round, and reactive to light.  Neck: Neck supple. No thyromegaly present.  Cardiovascular: Normal rate and regular rhythm.   Pulmonary/Chest: Effort normal and breath sounds normal. No respiratory distress. He has no wheezes. He has no rales.  Musculoskeletal: He exhibits no edema.  Neurological: He is alert and oriented to person, place, and time.       Assessment:     #1 patient needing Varicella vaccination.  He's had one previously and needs second adult vaccination  #2 mild hyperlipidemia. 10 year risk of CAD event 4.6%.  #3 chronic neck and upper back pain  #4 history of vitamin D deficiency    Plan:     -Flu vaccine and varicella vaccine given -Continue lifestyle management of lipids. Continue low saturated/trans fat diet -Recommend over-the-counter vitamin D 2000 international units once daily -Set up sports medicine referral  Curtis Post MD [facility] at [facility]

## 2017-02-05 NOTE — Patient Instructions (Signed)
Consider OTC Vit D 2,000 IU per day.

## 2017-02-10 ENCOUNTER — Telehealth: Payer: Self-pay | Admitting: Family Medicine

## 2017-02-10 ENCOUNTER — Encounter: Payer: Self-pay | Admitting: Family Medicine

## 2017-02-10 NOTE — Telephone Encounter (Signed)
Pt would like to get the MMR is he eligible for this?

## 2017-02-10 NOTE — Telephone Encounter (Signed)
If we can't confirm prior immunization status, OK to get.

## 2017-02-10 NOTE — Telephone Encounter (Signed)
Message sent to patient via MyChart. 

## 2017-02-11 ENCOUNTER — Ambulatory Visit (INDEPENDENT_AMBULATORY_CARE_PROVIDER_SITE_OTHER): Payer: BLUE CROSS/BLUE SHIELD

## 2017-02-11 DIAGNOSIS — Z23 Encounter for immunization: Secondary | ICD-10-CM | POA: Diagnosis not present

## 2017-02-22 NOTE — Progress Notes (Signed)
[facility] [address] [place], [place] [address] Phone: [phone] Subjective:    I'm seeing this patient by the request  of:  [name], MD   CC: Low back pain  YIR:[id number]  Axel Frisk is a 51 y.o. male coming in with complaint of neck pain. Since then it has been chronic for several years. States that it has the pain daily now. Has tried many different modalities including massage and physiotherapy without much improvement. Hasn't been in physical therapy as well as doing dry needling. Patient states the neck problem has lasted for about 10+ years. Last MRI was 2003. Pain is at C7. He has limited ROM.   Location- C7 to spine of scapula Duration- Patient states usually on more daily basis seems to be giving him some discomfort and pain. Character- Sore (tightness) Aggravating factors- light weights  Reliving factors- Ice and heat helps his pain Therapies tried- PT, deep tissue massage, heat, cold, acupuncture, Ibuprofen Severity-5 out of 10     Past Medical History:  Diagnosis Date  . Anxiety state, unspecified 01/25/2009  . BACK PAIN, UPPER 06/04/2010  . FATIGUE 11/07/2009  . HYPERLIPIDEMIA 01/15/2009  . HYPERTHYROIDISM 11/14/2009  . LOW BLOOD PRESSURE 11/07/2009  . PREDIABETES 01/25/2009  . REACTIVE AIRWAY DISEASE 03/19/2009   No past surgical history on file. Social History   Social History  . Marital status: Married    Spouse name: N/A  . Number of children: N/A  . Years of education: N/A   Social History Main Topics  . Smoking status: Former Smoker    Packs/day: 0.80    Years: 22.00    Types: Cigarettes    Quit date: 12/31/2006  . Smokeless tobacco: Never Used  . Alcohol use None  . Drug use: Unknown  . Sexual activity: Not Asked   Other Topics Concern  . None   Social History Narrative  . None   No Known Allergies Family History  Problem Relation Age of Onset  . Diabetes Mother   . Hypertension Mother       Past medical history, social, surgical and family history all reviewed in electronic medical record.  No pertanent information unless stated regarding to the chief complaint.   Review of Systems:Review of systems updated and as accurate as of 02/23/17  No headache, visual changes, nausea, vomiting, diarrhea, constipation, dizziness, abdominal pain, skin rash, fevers, chills, night sweats, weight loss, swollen lymph nodes, body aches, joint swelling, muscle aches, chest pain, shortness of breath, mood changes.   Objective  Blood pressure 100/70, pulse (!) 57, height 5\' 8"  (1.727 m), weight 162 lb (73.5 kg), SpO2 98 %. Systems examined below as of 02/23/17   General: No apparent distress alert and oriented x3 mood and affect normal, dressed appropriately.  HEENT: Pupils equal, extraocular movements intact  Respiratory: Patient's speak in full sentences and does not appear short of breath  Cardiovascular: No lower extremity edema, non tender, no erythema  Skin: Warm dry intact with no signs of infection or rash on extremities or on axial skeleton.  Abdomen: Soft nontender  Neuro: Cranial nerves II through XII are intact, neurovascularly intact in all extremities with 2+ DTRs and 2+ pulses.  Lymph: No lymphadenopathy of posterior or anterior cervical chain or axillae bilaterally.  Gait normal with good balance and coordination.  MSK:  Non tender with full range of motion and good stability and symmetric strength and tone of shoulders, elbows, wrist, hip, knee and  ankles bilaterally.  Neck: Inspection mild tightness with mild loss in lordosis. No palpable stepoffs. Negative Spurling's maneuver. Full neck range of motion Grip strength and sensation normal in bilateral hands Strength good C4 to T1 distribution No sensory change to C4 to T1 Negative Hoffman sign bilaterally Reflexes normal  Back Exam:  Inspection: Unremarkable  Motion: Flexion 45 deg, Extension 25 deg, Side Bending to  35 deg bilaterally,  Rotation to 45 deg bilaterally  SLR laying: Negative  XSLR laying: Negative  Palpable tenderness: Tender to palpation and appears palmar musculature lumbar spine more in the thoracolumbar juncture. Mild over the right scapular region. FABER: negative. Sensory change: Gross sensation intact to all lumbar and sacral dermatomes.  Reflexes: 2+ at both patellar tendons, 2+ at achilles tendons, Babinski's downgoing.  Strength at foot  Plantar-flexion: 5/5 Dorsi-flexion: 5/5 Eversion: 5/5 Inversion: 5/5  Leg strength  Quad: 5/5 Hamstring: 5/5 Hip flexor: 5/5 Hip abductors: 5/5  Gait unremarkable.  Osteopathic findings C2 flexed rotated and side bent right C4 flexed rotated and side bent left C6 flexed rotated and side bent left T3 extended rotated and side bent right inhaled third rib T9 extended rotated and side bent left L2 flexed rotated and side bent right Sacrum right on right     Impression and Recommendations:     This case required medical decision making of moderate complexity.      Note: This dictation was prepared with Dragon dictation along with smaller phrase technology. Any transcriptional errors that result from this process are unintentional.

## 2017-02-23 ENCOUNTER — Ambulatory Visit (INDEPENDENT_AMBULATORY_CARE_PROVIDER_SITE_OTHER): Payer: BLUE CROSS/BLUE SHIELD | Admitting: Family Medicine

## 2017-02-23 ENCOUNTER — Encounter: Payer: Self-pay | Admitting: Family Medicine

## 2017-02-23 VITALS — BP 100/70 | HR 57 | Ht 68.0 in | Wt 162.0 lb

## 2017-02-23 DIAGNOSIS — M542 Cervicalgia: Secondary | ICD-10-CM | POA: Insufficient documentation

## 2017-02-23 DIAGNOSIS — M999 Biomechanical lesion, unspecified: Secondary | ICD-10-CM | POA: Insufficient documentation

## 2017-02-23 MED ORDER — DICLOFENAC SODIUM 2 % TD SOLN: 2.0000 "application " | Freq: Two times a day (BID) | TRANSDERMAL | 3 refills | Status: DC

## 2017-02-23 MED ORDER — VITAMIN D (ERGOCALCIFEROL) 1.25 MG (50000 UNIT) PO CAPS: 50000.0000 [IU] | ORAL_CAPSULE | ORAL | 0 refills | Status: DC

## 2017-02-23 NOTE — Assessment & Plan Note (Signed)
Patient does have more posture issues. I believe that the neck pain is secondary to ergonomics. Given topical anti-inflammatories, icing regimen and we will get x-rays because patient is had this long-standing. I believe the patient will do well with this. Patient does wonder do more for formal physical therapy. Patient was referred today as well. Responded well to a supine manipulation. Discussed over-the-counter medications as well. Follow-up again in 4-6 weeks

## 2017-02-23 NOTE — Addendum Note (Signed)
Addended by: Lyndal Pulley on: 02/23/2017 03:18 PM   Modules accepted: Level of Service

## 2017-02-23 NOTE — Patient Instructions (Signed)
Good to see you.  Ice 20 minutes 2 times daily. Usually after activity and before bed. Exercises 3 times a week.  pennsaid pinkie amount topically 2 times daily as needed.  Once weekly vitamin D for 12 weeks.  Over the counter get  Turmeric 500mg  twice daily  Tart cherry extract any dose at night PT will be calling you  Ice when you need it.  2 tennis ball in a tube sock and lay on them where head meets neck  See me again in 4-5 weeks

## 2017-02-23 NOTE — Assessment & Plan Note (Signed)
Decision today to treat with OMT was based on Physical Exam  After verbal consent patient was treated with HVLA, ME, FPR techniques in cervical, thoracic, lumbar and sacral areas  Patient tolerated the procedure well with improvement in symptoms  Patient given exercises, stretches and lifestyle modifications  See medications in patient instructions if given  Patient will follow up in 4-6 weeks 

## 2017-02-25 ENCOUNTER — Encounter: Payer: Self-pay | Admitting: Family Medicine

## 2017-05-13 ENCOUNTER — Encounter: Payer: Self-pay | Admitting: Family Medicine

## 2017-07-16 ENCOUNTER — Encounter (HOSPITAL_BASED_OUTPATIENT_CLINIC_OR_DEPARTMENT_OTHER): Payer: Self-pay

## 2017-07-16 ENCOUNTER — Other Ambulatory Visit: Payer: Self-pay

## 2017-07-16 ENCOUNTER — Emergency Department (HOSPITAL_BASED_OUTPATIENT_CLINIC_OR_DEPARTMENT_OTHER)
Admission: EM | Admit: 2017-07-16 | Discharge: 2017-07-16 | Disposition: A | Discharge status: Home / Self Care | Payer: BLUE CROSS/BLUE SHIELD | Attending: Emergency Medicine | Admitting: Emergency Medicine

## 2017-07-16 DIAGNOSIS — H538 Other visual disturbances: Secondary | ICD-10-CM | POA: Insufficient documentation

## 2017-07-16 DIAGNOSIS — J45909 Unspecified asthma, uncomplicated: Secondary | ICD-10-CM | POA: Insufficient documentation

## 2017-07-16 DIAGNOSIS — Z79899 Other long term (current) drug therapy: Secondary | ICD-10-CM | POA: Insufficient documentation

## 2017-07-16 DIAGNOSIS — F17290 Nicotine dependence, other tobacco product, uncomplicated: Secondary | ICD-10-CM | POA: Insufficient documentation

## 2017-07-16 LAB — CBG MONITORING, ED: Glucose-Capillary: 87 mg/dL (ref 65–99)

## 2017-07-16 NOTE — ED Triage Notes (Signed)
Pt reports yesterday he had some slight pressure on the left side of his face. Pt reports he had some blurry vision and dizziness. Pt denies blurry vision and dizziness at this time. Pt reports he felt like something was "off" yesterday.

## 2017-07-16 NOTE — ED Provider Notes (Signed)
[facility] Provider Note   CSN: [id number] Arrival date & time: 07/16/17  0807     History   Chief Complaint Chief Complaint  Patient presents with  . Eye Problem    HPI Curtis Rodriguez is a 52 y.o. male.  He is presenting with a complaint of an episode yesterday with his left eye around 3 PM having cloudy peripheral vision.  He is unsure if it was both eyes or just his left but his left-sided vision was decreased.  There is no diplopia.  It more progressed so that his entire vision in that left eye was cloudy.  This lasted about 2 hours and then fully resolved.  He was at work and asked some colleagues to look at him all the symptoms were going on he realized he could not see them when they were standing to the side.  They did not appreciate any facial droop and the patient himself also does not recall any weakness or numbness or headache or pain.  He is never had this before.  He saw an optometrist in December who made a slight adjustment to his reading glasses.  Patient does a lot of computer work and has his glasses on and off during the day.  He states he is been reading a lot of numbers and computer work and he is under a good deal of stress but this is typical for him.  He does have frequent upper respiratory infections and last week he had a sore throat.  He does not endorse any kind of sinus symptoms right now.  There is no headache.  The history is provided by the patient.  Eye Problem   This is a new problem. The current episode started yesterday. The problem has been resolved. There is a problem in the left eye. There was no injury mechanism. The patient is experiencing no pain. There is no history of trauma to the eye. He does not wear contacts. Associated symptoms include blurred vision and decreased vision. Pertinent negatives include no numbness, no discharge, no double vision, no foreign body sensation, no photophobia, no eye redness, no nausea, no  vomiting, no tingling, no weakness and no itching. He has tried nothing for the symptoms.    Past Medical History:  Diagnosis Date  . Anxiety state, unspecified 01/25/2009  . BACK PAIN, UPPER 06/04/2010  . FATIGUE 11/07/2009  . HYPERLIPIDEMIA 01/15/2009  . HYPERTHYROIDISM 11/14/2009  . LOW BLOOD PRESSURE 11/07/2009  . PREDIABETES 01/25/2009  . REACTIVE AIRWAY DISEASE 03/19/2009    Patient Active Problem List   Diagnosis Date Noted  . Neck pain 02/23/2017  . Nonallopathic lesion of cervical region 02/23/2017  . Nonallopathic lesion of thoracic region 02/23/2017  . Nonallopathic lesion of lumbosacral region 02/23/2017  . Vitamin D deficiency disease 11/23/2011  . Backache 06/04/2010  . HYPERTHYROIDISM 11/14/2009  . LOW BLOOD PRESSURE 11/07/2009  . FATIGUE 11/07/2009  . Asthma 03/19/2009  . Anxiety state 01/25/2009  . PREDIABETES 01/25/2009  . Hyperlipidemia 01/15/2009    History reviewed. No pertinent surgical history.     Home Medications    Prior to Admission medications   Medication Sig Start Date End Date Taking? Authorizing Provider  cyanocobalamin 500 MCG tablet Take 500 mcg by mouth daily.   Yes [provider]  Diclofenac Sodium (PENNSAID) 2 % SOLN Place 2 application onto the skin 2 (two) times daily. 02/23/17   Lyndal Pulley, DO  Vitamin D, Ergocalciferol, (DRISDOL) 50000 units CAPS  capsule Take 1 capsule (50,000 Units total) by mouth every 7 (seven) days. 02/23/17   Lyndal Pulley, DO    Family History Family History  Problem Relation Age of Onset  . Diabetes Mother   . Hypertension Mother     Social History Social History   Tobacco Use  . Smoking status: Light Tobacco Smoker    Packs/day: 0.80    Years: 22.00    Pack years: 17.60    Types: Cigars    Last attempt to quit: 12/31/2006    Years since quitting: 10.5  . Smokeless tobacco: Never Used  Substance Use Topics  . Alcohol use: No    Frequency: Never  . Drug use: Not on file      Allergies   Sulfites   Review of Systems Review of Systems  Constitutional: Negative for chills and fever.  HENT: Negative for ear pain and sore throat.   Eyes: Positive for blurred vision. Negative for double vision, photophobia, pain, discharge, redness and visual disturbance.  Respiratory: Negative for cough and shortness of breath.   Cardiovascular: Negative for chest pain and palpitations.  Gastrointestinal: Negative for abdominal pain, nausea and vomiting.  Genitourinary: Negative for dysuria and hematuria.  Musculoskeletal: Negative for arthralgias and back pain.  Skin: Negative for color change, itching and rash.  Neurological: Negative for tingling, seizures, syncope, weakness and numbness.  All other systems reviewed and are negative.    Physical Exam Updated Vital Signs BP 111/81 (BP Location: Left Arm)   Pulse 60   Temp 98.2 F (36.8 C) (Oral)   Resp 18   Ht 5\' 8"  (1.727 m)   Wt 70.8 kg (156 lb)   SpO2 100%   BMI 23.72 kg/m   Physical Exam  Constitutional: He appears well-developed and well-nourished.  HENT:  Head: Normocephalic and atraumatic.  Eyes: Conjunctivae and EOM are normal. Pupils are equal, round, and reactive to light. Right eye exhibits no chemosis and no discharge. Left eye exhibits no chemosis and no discharge. No scleral icterus.  Fundoscopic exam:      The right eye shows no exudate, no hemorrhage and no papilledema.       The left eye shows no exudate, no hemorrhage and no papilledema.  Acuity 20/25 and 20/30  Neck: Neck supple.  Cardiovascular: Normal rate and regular rhythm.  Pulmonary/Chest: Effort normal and breath sounds normal.  Abdominal: Soft. Bowel sounds are normal.  Neurological: He is alert. He has normal strength. No cranial nerve deficit or sensory deficit. Gait normal. GCS eye subscore is 4. GCS verbal subscore is 5. GCS motor subscore is 6.  Skin: Skin is warm and dry.  Psychiatric: He has a normal mood and affect.   Nursing note and vitals reviewed.    ED Treatments / Results  Labs (all labs ordered are listed, but only abnormal results are displayed) Labs Reviewed  CBG MONITORING, ED    EKG  EKG Interpretation None       Radiology No results found.  Procedures Procedures (including critical care time)  Medications Ordered in ED Medications - No data to display   Initial Impression / Assessment and Plan / ED Course  I have reviewed the triage vital signs and the nursing notes.  Pertinent labs & imaging results that were available during my care of the patient were reviewed by me and considered in my medical decision making (see chart for details).  Clinical Course as of Jul 17 922  Fri Jul 16, 2017  9811 Discussed with ophthalmologist Rolanda [name], who after hearing about the patient's symptoms and resolution feels this is likely an eye surface problem as opposed to a retina problem.  She states it could be TIA although less likely in the setting of this gentleman has no real risk factors and is very cardiovascularly fit.  She recommended artificial tears to cover the surface and will see him on Monday in the office.  I reviewed this with the patient he is very comfortable with the plan.  He understands to return if any worsening symptoms.  [MB]    Clinical Course User Index [MB] Hayden Rasmussen, MD     Final Clinical Impressions(s) / ED Diagnoses   Final diagnoses:  Blurry vision, left eye    ED Discharge Orders    None       Hayden Rasmussen, MD 07/16/17 [phone]

## 2017-07-16 NOTE — Discharge Instructions (Signed)
Your evaluated in the emergency department for an episode of blurred vision in the left eye.  This is resolved prior to arrival in the emergency department.  Your exam here did not reveal an obvious cause of the symptoms.  We are making you an appointment with an ophthalmologist to see you on Monday.  He should keep his appointment and see this doctor.  Dr. Nancy Fetter also recommended using artificial tears to lubricate the surface of the eye as this may be a cause of your symptoms.  You can get this at the pharmacy, and does not need a prescription.  Please return or go to the closest emergency department if your symptoms acutely worsen.

## 2017-07-19 DIAGNOSIS — H40013 Open angle with borderline findings, low risk, bilateral: Secondary | ICD-10-CM | POA: Diagnosis not present

## 2017-07-19 DIAGNOSIS — H04123 Dry eye syndrome of bilateral lacrimal glands: Secondary | ICD-10-CM | POA: Diagnosis not present

## 2017-09-24 DIAGNOSIS — L853 Xerosis cutis: Secondary | ICD-10-CM | POA: Diagnosis not present

## 2017-09-24 DIAGNOSIS — L309 Dermatitis, unspecified: Secondary | ICD-10-CM | POA: Diagnosis not present

## 2017-11-22 DIAGNOSIS — R0982 Postnasal drip: Secondary | ICD-10-CM | POA: Diagnosis not present

## 2017-11-22 DIAGNOSIS — J069 Acute upper respiratory infection, unspecified: Secondary | ICD-10-CM | POA: Diagnosis not present

## 2018-03-16 DIAGNOSIS — J069 Acute upper respiratory infection, unspecified: Secondary | ICD-10-CM | POA: Diagnosis not present

## 2018-06-14 ENCOUNTER — Ambulatory Visit: Payer: BLUE CROSS/BLUE SHIELD | Admitting: Family Medicine

## 2018-06-14 ENCOUNTER — Encounter: Payer: Self-pay | Admitting: Family Medicine

## 2018-06-14 VITALS — BP 120/62 | HR 53 | Temp 97.9°F | Wt 162.6 lb

## 2018-06-14 DIAGNOSIS — J069 Acute upper respiratory infection, unspecified: Secondary | ICD-10-CM | POA: Diagnosis not present

## 2018-06-14 NOTE — Progress Notes (Signed)
  Subjective:     Patient ID: Curtis Rodriguez, male   DOB: Jul 23, 1965, 53 y.o.   MRN: [id number]  HPI Patient is here to discuss some recent respiratory symptoms.  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  Just got back January 29 from a trip to [place] and [place].  He also been to [place] not long ago and also [place].  He states that when he got back he had some mild nasal congestive symptoms.  He has not had any recent cough.  No fever.  No chills.  No myalgias.  Increased job stress and works lots of hours.  Has not been able to exercise much recently.  Never smoked.  Past Medical History:  Diagnosis Date  . Anxiety state, unspecified 01/25/2009  . BACK PAIN, UPPER 06/04/2010  . FATIGUE 11/07/2009  . HYPERLIPIDEMIA 01/15/2009  . HYPERTHYROIDISM 11/14/2009  . LOW BLOOD PRESSURE 11/07/2009  . PREDIABETES 01/25/2009  . REACTIVE AIRWAY DISEASE 03/19/2009   No past surgical history on file.  reports that he has been smoking cigars. He has a 17.60 pack-year smoking history. He has never used smokeless tobacco. He reports that he does not drink alcohol. No history on file for drug. family history includes Diabetes in his mother; Hypertension in his mother. Allergies  Allergen Reactions  . Sulfites Rash    Sulfates/sulfites     Review of Systems  Constitutional: Negative for chills and fever.  HENT: Negative for sore throat.   Respiratory: Negative for cough.   Neurological: Negative for headaches.  Hematological: Negative for adenopathy.       Objective:   Physical Exam Constitutional:      Appearance: Normal appearance.  HENT:     Right Ear: Tympanic membrane normal.     Left Ear: Tympanic membrane normal.     Mouth/Throat:     Pharynx: Oropharynx is clear. No oropharyngeal exudate.  Neck:     Musculoskeletal: Neck supple.  Cardiovascular:     Rate and Rhythm: Normal rate and regular rhythm.  Pulmonary:     Effort: Pulmonary effort is normal.   Breath sounds: Normal breath sounds. No wheezing or rales.  Lymphadenopathy:     Cervical: No cervical adenopathy.  Neurological:     Mental Status: He is alert.        Assessment:     Recent viral URI.  Symptoms improving and nonfocal exam at this time    Plan:     -Discussed prevention of infection especially with his frequent international travel  -Recommend set up complete physical which he has not had in a couple years  [name] MD [facility] at [facility]

## 2018-06-14 NOTE — Patient Instructions (Signed)
Set up complete physical.   

## 2018-06-20 ENCOUNTER — Encounter: Payer: Self-pay | Admitting: Family Medicine

## 2018-06-20 ENCOUNTER — Encounter: Payer: Self-pay | Admitting: Gastroenterology

## 2018-06-20 ENCOUNTER — Ambulatory Visit (INDEPENDENT_AMBULATORY_CARE_PROVIDER_SITE_OTHER): Payer: BLUE CROSS/BLUE SHIELD | Admitting: Family Medicine

## 2018-06-20 ENCOUNTER — Other Ambulatory Visit: Payer: Self-pay

## 2018-06-20 VITALS — BP 122/82 | HR 48 | Temp 98.0°F | Ht 66.75 in | Wt 158.9 lb

## 2018-06-20 DIAGNOSIS — Z0000 Encounter for general adult medical examination without abnormal findings: Secondary | ICD-10-CM | POA: Diagnosis not present

## 2018-06-20 DIAGNOSIS — D229 Melanocytic nevi, unspecified: Secondary | ICD-10-CM | POA: Diagnosis not present

## 2018-06-20 DIAGNOSIS — Z23 Encounter for immunization: Secondary | ICD-10-CM

## 2018-06-20 DIAGNOSIS — Z113 Encounter for screening for infections with a predominantly sexual mode of transmission: Secondary | ICD-10-CM

## 2018-06-20 LAB — HEPATIC FUNCTION PANEL
ALBUMIN: 4.3 g/dL (ref 3.5–5.2)
ALK PHOS: 40 U/L (ref 39–117)
ALT: 19 U/L (ref 0–53)
AST: 20 U/L (ref 0–37)
BILIRUBIN DIRECT: 0.1 mg/dL (ref 0.0–0.3)
TOTAL PROTEIN: 6.9 g/dL (ref 6.0–8.3)
Total Bilirubin: 0.4 mg/dL (ref 0.2–1.2)

## 2018-06-20 LAB — CBC WITH DIFFERENTIAL/PLATELET
BASOS ABS: 0 10*3/uL (ref 0.0–0.1)
Basophils Relative: 1.1 % (ref 0.0–3.0)
EOS PCT: 4.6 % (ref 0.0–5.0)
Eosinophils Absolute: 0.2 10*3/uL (ref 0.0–0.7)
HCT: 43.3 % (ref 39.0–52.0)
HEMOGLOBIN: 14.3 g/dL (ref 13.0–17.0)
LYMPHS ABS: 1.7 10*3/uL (ref 0.7–4.0)
Lymphocytes Relative: 44.2 % (ref 12.0–46.0)
MCHC: 32.9 g/dL (ref 30.0–36.0)
MCV: 88.1 fl (ref 78.0–100.0)
MONOS PCT: 8.3 % (ref 3.0–12.0)
Monocytes Absolute: 0.3 10*3/uL (ref 0.1–1.0)
NEUTROS PCT: 41.8 % — AB (ref 43.0–77.0)
Neutro Abs: 1.6 10*3/uL (ref 1.4–7.7)
Platelets: 172 10*3/uL (ref 150.0–400.0)
RBC: 4.92 Mil/uL (ref 4.22–5.81)
RDW: 13.6 % (ref 11.5–15.5)
WBC: 3.9 10*3/uL — AB (ref 4.0–10.5)

## 2018-06-20 LAB — LIPID PANEL
CHOL/HDL RATIO: 5
Cholesterol: 231 mg/dL — ABNORMAL HIGH (ref 0–200)
HDL: 49.5 mg/dL (ref >39.00)
LDL Cholesterol: 162 mg/dL — ABNORMAL HIGH (ref 0–99)
NONHDL: 181.09
Triglycerides: 95 mg/dL (ref 0.0–149.0)
VLDL: 19 mg/dL (ref 0.0–40.0)

## 2018-06-20 LAB — BASIC METABOLIC PANEL
BUN: 15 mg/dL (ref 6–23)
CALCIUM: 9.4 mg/dL (ref 8.4–10.5)
CO2: 32 mEq/L (ref 19–32)
CREATININE: 0.97 mg/dL (ref 0.40–1.50)
Chloride: 101 mEq/L (ref 96–112)
GFR: 81.7 mL/min (ref >60.00)
GLUCOSE: 90 mg/dL (ref 70–99)
Potassium: 4.4 mEq/L (ref 3.5–5.1)
SODIUM: 138 meq/L (ref 135–145)

## 2018-06-20 LAB — TSH: TSH: 1.54 u[IU]/mL (ref 0.35–4.50)

## 2018-06-20 LAB — PSA: PSA: 0.96 ng/mL (ref 0.10–4.00)

## 2018-06-20 NOTE — Progress Notes (Addendum)
Subjective:     Patient ID: Curtis Rodriguez, male   DOB: Jan 18, 1966, 53 y.o.   MRN: [id number]  HPI Patient is seen for physical.  Generally very healthy.  No major chronic medical problems.  He exercises several times per week.  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  Frequent travel.  Divorced 2017. Takes no medications currently.  Just turned 52.  He had colonoscopy back around 2009 and apparently no polyps then.  No family history of colon cancer.  Still needs flu vaccine.  No history of shingles vaccine.  Tetanus up-to-date.    Past Medical History:  Diagnosis Date  . Anxiety state, unspecified 01/25/2009  . BACK PAIN, UPPER 06/04/2010  . FATIGUE 11/07/2009  . HYPERLIPIDEMIA 01/15/2009  . HYPERTHYROIDISM 11/14/2009  . LOW BLOOD PRESSURE 11/07/2009  . PREDIABETES 01/25/2009  . REACTIVE AIRWAY DISEASE 03/19/2009   History reviewed. No pertinent surgical history.  reports that he has been smoking cigars. He has a 17.60 pack-year smoking history. He has never used smokeless tobacco. He reports that he does not drink alcohol. No history on file for drug. family history includes Diabetes in his mother; Hypertension in his mother. Allergies  Allergen Reactions  . Sulfites Rash    Sulfates/sulfites     Review of Systems  Constitutional: Negative for activity change, appetite change, fatigue and fever.  HENT: Negative for congestion, ear pain and trouble swallowing.   Eyes: Negative for pain and visual disturbance.  Respiratory: Negative for cough, shortness of breath and wheezing.   Cardiovascular: Negative for chest pain and palpitations.  Gastrointestinal: Negative for abdominal distention, abdominal pain, blood in stool, constipation, diarrhea, nausea, rectal pain and vomiting.  Genitourinary: Negative for dysuria, hematuria and testicular pain.  Musculoskeletal: Negative for arthralgias and joint swelling.  Skin: Negative for rash.  Neurological: Negative for  dizziness, syncope and headaches.  Hematological: Negative for adenopathy.  Psychiatric/Behavioral: Negative for confusion and dysphoric mood.       Objective:   Physical Exam Constitutional:      General: He is not in acute distress.    Appearance: He is well-developed.  HENT:     Head: Normocephalic and atraumatic.     Right Ear: External ear normal.     Left Ear: External ear normal.  Eyes:     Conjunctiva/sclera: Conjunctivae normal.     Pupils: Pupils are equal, round, and reactive to light.  Neck:     Musculoskeletal: Normal range of motion and neck supple.     Thyroid: No thyromegaly.  Cardiovascular:     Rate and Rhythm: Normal rate and regular rhythm.     Heart sounds: Normal heart sounds. No murmur.  Pulmonary:     Effort: No respiratory distress.     Breath sounds: No wheezing or rales.  Abdominal:     General: Bowel sounds are normal. There is no distension.     Palpations: Abdomen is soft. There is no mass.     Tenderness: There is no abdominal tenderness. There is no guarding or rebound.  Lymphadenopathy:     Cervical: No cervical adenopathy.  Skin:    Findings: No rash.  Neurological:     Mental Status: He is alert and oriented to person, place, and time.     Cranial Nerves: No cranial nerve deficit.     Deep Tendon Reflexes: Reflexes normal.        Assessment:     Physical exam.  Generally healthy 53 year old male.  Several health maintenance issues were addressed as below    Plan:     -Recommend flu vaccine and patient consents -We discussed shingles vaccine and he will check on insurance coverage -Set up screening colonoscopy -Patient requesting dermatology referral.  He has some benign-appearing nevi on the face he would like to have addressed -Obtain screening labs including PSA.  Patient is also requesting STD screening and this will be set up.  He is asymptomatic though.  [name] MD [facility] at [facility]

## 2018-06-20 NOTE — Patient Instructions (Signed)
Consider shingles vaccine (Shingrix) and check with insurance if interested.

## 2018-06-21 LAB — C. TRACHOMATIS/N. GONORRHOEAE RNA
C. TRACHOMATIS RNA, TMA: NOT DETECTED
N. GONORRHOEAE RNA, TMA: NOT DETECTED

## 2018-06-21 LAB — RPR: RPR Ser Ql: NONREACTIVE

## 2018-06-21 LAB — HIV ANTIBODY (ROUTINE TESTING W REFLEX): HIV 1&2 Ab, 4th Generation: NONREACTIVE

## 2018-06-24 ENCOUNTER — Ambulatory Visit (AMBULATORY_SURGERY_CENTER): Payer: Self-pay

## 2018-06-24 VITALS — Ht 67.0 in | Wt 163.4 lb

## 2018-06-24 DIAGNOSIS — Z1211 Encounter for screening for malignant neoplasm of colon: Secondary | ICD-10-CM

## 2018-06-24 MED ORDER — PEG-KCL-NACL-NASULF-NA ASC-C 140 G PO SOLR: 1.0000 | Freq: Once | ORAL | Status: AC

## 2018-06-24 NOTE — Progress Notes (Signed)
Per pt, no allergies to soy or egg products.Pt not taking any weight loss meds or using  O2 at home.  Pt refused emmi video. 

## 2018-06-29 ENCOUNTER — Encounter: Payer: Self-pay | Admitting: Gastroenterology

## 2018-07-13 ENCOUNTER — Encounter: Payer: BLUE CROSS/BLUE SHIELD | Admitting: Gastroenterology

## 2018-07-22 ENCOUNTER — Encounter: Payer: BLUE CROSS/BLUE SHIELD | Admitting: Gastroenterology

## 2018-08-25 DIAGNOSIS — L718 Other rosacea: Secondary | ICD-10-CM | POA: Diagnosis not present

## 2018-08-25 DIAGNOSIS — D485 Neoplasm of uncertain behavior of skin: Secondary | ICD-10-CM | POA: Diagnosis not present

## 2018-11-24 DIAGNOSIS — L821 Other seborrheic keratosis: Secondary | ICD-10-CM | POA: Diagnosis not present

## 2018-11-24 DIAGNOSIS — L719 Rosacea, unspecified: Secondary | ICD-10-CM | POA: Diagnosis not present

## 2018-12-23 DIAGNOSIS — L821 Other seborrheic keratosis: Secondary | ICD-10-CM | POA: Diagnosis not present

## 2018-12-23 DIAGNOSIS — L810 Postinflammatory hyperpigmentation: Secondary | ICD-10-CM | POA: Diagnosis not present

## 2019-03-01 DIAGNOSIS — Z20828 Contact with and (suspected) exposure to other viral communicable diseases: Secondary | ICD-10-CM | POA: Diagnosis not present

## 2019-03-29 DIAGNOSIS — Z20828 Contact with and (suspected) exposure to other viral communicable diseases: Secondary | ICD-10-CM | POA: Diagnosis not present

## 2019-04-20 ENCOUNTER — Telehealth: Payer: Self-pay | Admitting: *Deleted

## 2019-04-20 ENCOUNTER — Telehealth: Payer: Self-pay | Admitting: Family Medicine

## 2019-04-20 DIAGNOSIS — Z20828 Contact with and (suspected) exposure to other viral communicable diseases: Secondary | ICD-10-CM | POA: Diagnosis not present

## 2019-04-20 NOTE — Telephone Encounter (Signed)
Please advise 

## 2019-04-20 NOTE — Telephone Encounter (Signed)
Copied from [place] [id number]. Topic: Quick Communication - Rx Refill/Question >> Apr 20, 2019 11:35 AM Rainey Pines A wrote: Medication: albuterol (PROVENTIL HFA;VENTOLIN HFA) 108 (90 BASE) MCG/ACT inhaler ,azithromycin (ZITHROMAX) 250 MG tablet  Has the patient contacted their pharmacy? Yes (Agent: If no, request that the patient contact the pharmacy for the refill.) (Agent: If yes, when and what did the pharmacy advise?)Contact Pcp  Preferred Pharmacy (with phone number or street name): [id number] - [address], [place]  Phone:  [phone] Fax:  [phone]     Agent: Please be advised that RX refills may take up to 3 business days. We ask that you follow-up with your pharmacy.

## 2019-04-20 NOTE — Telephone Encounter (Signed)
Copied from [place] [phone]. Topic: General - Other >> Apr 20, 2019 11:40 AM Rainey Pines A wrote: Patient requesting callback from nurse in regards to having order placed for chest xray. Please advise

## 2019-04-21 ENCOUNTER — Other Ambulatory Visit: Payer: Self-pay

## 2019-04-21 ENCOUNTER — Encounter: Payer: Self-pay | Admitting: Family Medicine

## 2019-04-21 ENCOUNTER — Telehealth (INDEPENDENT_AMBULATORY_CARE_PROVIDER_SITE_OTHER): Payer: BC Managed Care – PPO | Admitting: Family Medicine

## 2019-04-21 VITALS — Ht 67.0 in | Wt 157.0 lb

## 2019-04-21 DIAGNOSIS — J019 Acute sinusitis, unspecified: Secondary | ICD-10-CM | POA: Diagnosis not present

## 2019-04-21 MED ORDER — LORATADINE 10 MG PO TABS: 10.0000 mg | ORAL_TABLET | Freq: Every day | ORAL | 11 refills | Status: DC

## 2019-04-21 MED ORDER — ALBUTEROL SULFATE HFA 108 (90 BASE) MCG/ACT IN AERS: 2.0000 | INHALATION_SPRAY | RESPIRATORY_TRACT | 0 refills | Status: DC | PRN

## 2019-04-21 MED ORDER — AZITHROMYCIN 250 MG PO TABS: ORAL_TABLET | ORAL | 0 refills | Status: DC

## 2019-04-21 NOTE — Telephone Encounter (Signed)
Called patient and he stated that he has several doctors in his family and they have requested that he gets an xray done. Patient stated that he will wait to do at his physical. I advised for him to call to schedule his physical around 06/20/18 or after. Patient verbalized an understanding.

## 2019-04-21 NOTE — Progress Notes (Signed)
Virtual Visit via Video Note  I connected with the patient on 04/21/19 at  8:45 AM EST by a video enabled telemedicine application and verified that I am speaking with the correct person using two identifiers.  Location patient: home Location provider:work or home office Persons participating in the virtual visit: patient, provider  I discussed the limitations of evaluation and management by telemedicine and the availability of in person appointments. The patient expressed understanding and agreed to proceed.   HPI: Here for one week of stufy head, PND, ST, and a dry cough. No fever or body aches. No SOB or chest pain. No NVD. Using Claritin.    ROS: See pertinent positives and negatives per HPI.  Past Medical History:  Diagnosis Date  . Anxiety state, unspecified 01/25/2009  . BACK PAIN, UPPER 06/04/2010  . FATIGUE 11/07/2009  . HYPERLIPIDEMIA 01/15/2009   no meds  . HYPERTHYROIDISM 11/14/2009  . LOW BLOOD PRESSURE 11/07/2009  . PREDIABETES 01/25/2009  . REACTIVE AIRWAY DISEASE 03/19/2009    Past Surgical History:  Procedure Laterality Date  . COLONOSCOPY  2009   Unsure per pt.    Family History  Problem Relation Age of Onset  . Diabetes Mother   . Hypertension Mother      Current Outpatient Medications:  Marland Kitchen  Multiple Vitamins-Minerals (MULTIVITAMIN ADULT PO), Take 1 tablet by mouth daily., Disp: , Rfl:  .  vitamin B-12 (CYANOCOBALAMIN) 100 MCG tablet, Take 100 mcg by mouth daily., Disp: , Rfl:  .  albuterol (PROAIR HFA) 108 (90 Base) MCG/ACT inhaler, Inhale 2 puffs into the lungs every 4 (four) hours as needed for wheezing or shortness of breath., Disp: 18 g, Rfl: 0 .  azithromycin (ZITHROMAX Z-PAK) 250 MG tablet, As directed, Disp: 6 each, Rfl: 0 .  loratadine (CLARITIN) 10 MG tablet, Take 1 tablet (10 mg total) by mouth daily., Disp: 30 tablet, Rfl: 11  EXAM:  VITALS per patient if applicable:  GENERAL: alert, oriented, appears well and in no acute distress  HEENT:  atraumatic, conjunttiva clear, no obvious abnormalities on inspection of external nose and ears  NECK: normal movements of the head and neck  LUNGS: on inspection no signs of respiratory distress, breathing rate appears normal, no obvious gross SOB, gasping or wheezing  CV: no obvious cyanosis  MS: moves all visible extremities without noticeable abnormality  PSYCH/NEURO: pleasant and cooperative, no obvious depression or anxiety, speech and thought processing grossly intact  ASSESSMENT AND PLAN: Sinusitis, treat with a Zpack,. Refill his Proair to use as needed.  Alysia Penna, MD  Discussed the following assessment and plan:  No diagnosis found.     I discussed the assessment and treatment plan with the patient. The patient was provided an opportunity to ask questions and all were answered. The patient agreed with the plan and demonstrated an understanding of the instructions.   The patient was advised to call back or seek an in-person evaluation if the symptoms worsen or if the condition fails to improve as anticipated.

## 2019-04-21 NOTE — Telephone Encounter (Signed)
I have scheduled a Doxy at 8:45am

## 2019-06-30 ENCOUNTER — Encounter: Payer: BC Managed Care – PPO | Admitting: Family Medicine

## 2019-07-04 ENCOUNTER — Other Ambulatory Visit: Payer: Self-pay

## 2019-07-05 ENCOUNTER — Ambulatory Visit (INDEPENDENT_AMBULATORY_CARE_PROVIDER_SITE_OTHER): Payer: BC Managed Care – PPO | Admitting: Family Medicine

## 2019-07-05 ENCOUNTER — Encounter: Payer: Self-pay | Admitting: Family Medicine

## 2019-07-05 VITALS — BP 102/68 | HR 57 | Temp 97.2°F | Ht 68.0 in | Wt 160.0 lb

## 2019-07-05 DIAGNOSIS — Z0000 Encounter for general adult medical examination without abnormal findings: Secondary | ICD-10-CM

## 2019-07-05 DIAGNOSIS — Z1211 Encounter for screening for malignant neoplasm of colon: Secondary | ICD-10-CM | POA: Diagnosis not present

## 2019-07-05 LAB — BASIC METABOLIC PANEL
BUN: 13 mg/dL (ref 6–23)
CO2: 26 mEq/L (ref 19–32)
Calcium: 9.8 mg/dL (ref 8.4–10.5)
Chloride: 104 mEq/L (ref 96–112)
Creatinine, Ser: 1.11 mg/dL (ref 0.40–1.50)
GFR: 69.64 mL/min (ref >60.00)
Glucose, Bld: 119 mg/dL — ABNORMAL HIGH (ref 70–99)
Potassium: 4.6 mEq/L (ref 3.5–5.1)
Sodium: 137 mEq/L (ref 135–145)

## 2019-07-05 LAB — CBC WITH DIFFERENTIAL/PLATELET
Basophils Absolute: 0 10*3/uL (ref 0.0–0.1)
Basophils Relative: 0.6 % (ref 0.0–3.0)
Eosinophils Absolute: 0.2 10*3/uL (ref 0.0–0.7)
Eosinophils Relative: 4.9 % (ref 0.0–5.0)
HCT: 42 % (ref 39.0–52.0)
Hemoglobin: 14 g/dL (ref 13.0–17.0)
Lymphocytes Relative: 39.5 % (ref 12.0–46.0)
Lymphs Abs: 1.6 10*3/uL (ref 0.7–4.0)
MCHC: 33.3 g/dL (ref 30.0–36.0)
MCV: 88 fl (ref 78.0–100.0)
Monocytes Absolute: 0.4 10*3/uL (ref 0.1–1.0)
Monocytes Relative: 9.2 % (ref 3.0–12.0)
Neutro Abs: 1.8 10*3/uL (ref 1.4–7.7)
Neutrophils Relative %: 45.8 % (ref 43.0–77.0)
Platelets: 164 10*3/uL (ref 150.0–400.0)
RBC: 4.77 Mil/uL (ref 4.22–5.81)
RDW: 14.1 % (ref 11.5–15.5)
WBC: 4 10*3/uL (ref 4.0–10.5)

## 2019-07-05 LAB — HEPATIC FUNCTION PANEL
ALT: 23 U/L (ref 0–53)
AST: 24 U/L (ref 0–37)
Albumin: 4.4 g/dL (ref 3.5–5.2)
Alkaline Phosphatase: 38 U/L — ABNORMAL LOW (ref 39–117)
Bilirubin, Direct: 0.1 mg/dL (ref 0.0–0.3)
Total Bilirubin: 0.4 mg/dL (ref 0.2–1.2)
Total Protein: 7.1 g/dL (ref 6.0–8.3)

## 2019-07-05 LAB — LIPID PANEL
Cholesterol: 241 mg/dL — ABNORMAL HIGH (ref 0–200)
HDL: 47.8 mg/dL (ref >39.00)
LDL Cholesterol: 174 mg/dL — ABNORMAL HIGH (ref 0–99)
NonHDL: 192.84
Total CHOL/HDL Ratio: 5
Triglycerides: 94 mg/dL (ref 0.0–149.0)
VLDL: 18.8 mg/dL (ref 0.0–40.0)

## 2019-07-05 LAB — TSH: TSH: 2.18 u[IU]/mL (ref 0.35–4.50)

## 2019-07-05 LAB — PSA: PSA: 0.68 ng/mL (ref 0.10–4.00)

## 2019-07-05 MED ORDER — DM-GUAIFENESIN ER 30-600 MG PO TB12: 1.0000 | ORAL_TABLET | Freq: Two times a day (BID) | ORAL | 0 refills | Status: AC

## 2019-07-05 MED ORDER — AZITHROMYCIN 250 MG PO TABS: ORAL_TABLET | ORAL | 0 refills | Status: DC

## 2019-07-05 NOTE — Patient Instructions (Signed)
Zoster Vaccine, Recombinant injection What is this medicine? ZOSTER VACCINE (ZOS ter vak SEEN) is used to prevent shingles in adults 52 years old and over. This vaccine is not used to treat shingles or nerve pain from shingles. This medicine may be used for other purposes; ask your health care provider or pharmacist if you have questions. COMMON BRAND NAME(S): [facility] What should I tell my health care provider before I take this medicine? They need to know if you have any of these conditions:  blood disorders or disease  cancer like leukemia or lymphoma  immune system problems or therapy  an unusual or allergic reaction to vaccines, other medications, foods, dyes, or preservatives  pregnant or trying to get pregnant  breast-feeding How should I use this medicine? This vaccine is for injection in a muscle. It is given by a health care professional. Talk to your pediatrician regarding the use of this medicine in children. This medicine is not approved for use in children. Overdosage: If you think you have taken too much of this medicine contact a poison control center or emergency room at once. NOTE: This medicine is only for you. Do not share this medicine with others. What if I miss a dose? Keep appointments for follow-up (booster) doses as directed. It is important not to miss your dose. Call your doctor or health care professional if you are unable to keep an appointment. What may interact with this medicine?  medicines that suppress your immune system  medicines to treat cancer  steroid medicines like prednisone or cortisone This list may not describe all possible interactions. Give your health care provider a list of all the medicines, herbs, non-prescription drugs, or dietary supplements you use. Also tell them if you smoke, drink alcohol, or use illegal drugs. Some items may interact with your medicine. What should I watch for while using this medicine? Visit your doctor for  regular check ups. This vaccine, like all vaccines, may not fully protect everyone. What side effects may I notice from receiving this medicine? Side effects that you should report to your doctor or health care professional as soon as possible:  allergic reactions like skin rash, itching or hives, swelling of the face, lips, or tongue  breathing problems Side effects that usually do not require medical attention (report these to your doctor or health care professional if they continue or are bothersome):  chills  headache  fever  nausea, vomiting  redness, warmth, pain, swelling or itching at site where injected  tiredness This list may not describe all possible side effects. Call your doctor for medical advice about side effects. You may report side effects to FDA at 1-800-FDA-1088. Where should I keep my medicine? This vaccine is only given in a clinic, pharmacy, doctor's office, or other health care setting and will not be stored at home. NOTE: This sheet is a summary. It may not cover all possible information. If you have questions about this medicine, talk to your doctor, pharmacist, or health care provider.  2020 Elsevier/Gold Standard (2016-12-07 13:20:30) Preventive Care 41-79 Years Old, Male Preventive care refers to lifestyle choices and visits with your health care provider that can promote health and wellness. This includes:  A yearly physical exam. This is also called an annual well check.  Regular dental and eye exams.  Immunizations.  Screening for certain conditions.  Healthy lifestyle choices, such as eating a healthy diet, getting regular exercise, not using drugs or products that contain nicotine and tobacco, and  limiting alcohol use. What can I expect for my preventive care visit? Physical exam Your health care provider will check:  Height and weight. These may be used to calculate body mass index (BMI), which is a measurement that tells if you are at a  healthy weight.  Heart rate and blood pressure.  Your skin for abnormal spots. Counseling Your health care provider may ask you questions about:  Alcohol, tobacco, and drug use.  Emotional well-being.  Home and relationship well-being.  Sexual activity.  Eating habits.  Work and work Statistician. What immunizations do I need?  Influenza (flu) vaccine  This is recommended every year. Tetanus, diphtheria, and pertussis (Tdap) vaccine  You may need a Td booster every 10 years. Varicella (chickenpox) vaccine  You may need this vaccine if you have not already been vaccinated. Zoster (shingles) vaccine  You may need this after age 66. Measles, mumps, and rubella (MMR) vaccine  You may need at least one dose of MMR if you were born in 1957 or later. You may also need a second dose. Pneumococcal conjugate (PCV13) vaccine  You may need this if you have certain conditions and were not previously vaccinated. Pneumococcal polysaccharide (PPSV23) vaccine  You may need one or two doses if you smoke cigarettes or if you have certain conditions. Meningococcal conjugate (MenACWY) vaccine  You may need this if you have certain conditions. Hepatitis A vaccine  You may need this if you have certain conditions or if you travel or work in places where you may be exposed to hepatitis A. Hepatitis B vaccine  You may need this if you have certain conditions or if you travel or work in places where you may be exposed to hepatitis B. Haemophilus influenzae type b (Hib) vaccine  You may need this if you have certain risk factors. Human papillomavirus (HPV) vaccine  If recommended by your health care provider, you may need three doses over 6 months. You may receive vaccines as individual doses or as more than one vaccine together in one shot (combination vaccines). Talk with your health care provider about the risks and benefits of combination vaccines. What tests do I need? Blood  tests  Lipid and cholesterol levels. These may be checked every 5 years, or more frequently if you are over 20 years old.  Hepatitis C test.  Hepatitis B test. Screening  Lung cancer screening. You may have this screening every year starting at age 63 if you have a 30-pack-year history of smoking and currently smoke or have quit within the past 15 years.  Prostate cancer screening. Recommendations will vary depending on your family history and other risks.  Colorectal cancer screening. All adults should have this screening starting at age 80 and continuing until age 52. Your health care provider may recommend screening at age 22 if you are at increased risk. You will have tests every 1-10 years, depending on your results and the type of screening test.  Diabetes screening. This is done by checking your blood sugar (glucose) after you have not eaten for a while (fasting). You may have this done every 1-3 years.  Sexually transmitted disease (STD) testing. Follow these instructions at home: Eating and drinking  Eat a diet that includes fresh fruits and vegetables, whole grains, lean protein, and low-fat dairy products.  Take vitamin and mineral supplements as recommended by your health care provider.  Do not drink alcohol if your health care provider tells you not to drink.  If you drink  alcohol: ? Limit how much you have to 0-2 drinks a day. ? Be aware of how much alcohol is in your drink. In the [place] one drink equals one 12 oz bottle of beer (355 mL), one 5 oz glass of wine (148 mL), or one 1 oz glass of hard liquor (44 mL). Lifestyle  Take daily care of your teeth and gums.  Stay active. Exercise for at least 30 minutes on 5 or more days each week.  Do not use any products that contain nicotine or tobacco, such as cigarettes, e-cigarettes, and chewing tobacco. If you need help quitting, ask your health care provider.  If you are sexually active, practice safe sex. Use a  condom or other form of protection to prevent STIs (sexually transmitted infections).  Talk with your health care provider about taking a low-dose aspirin every day starting at age 74. What's next?  Go to your health care provider once a year for a well check visit.  Ask your health care provider how often you should have your eyes and teeth checked.  Stay up to date on all vaccines. This information is not intended to replace advice given to you by your health care provider. Make sure you discuss any questions you have with your health care provider. Document Revised: 04/21/2018 Document Reviewed: 04/21/2018 Elsevier Patient Education  2020 [employer].

## 2019-07-05 NOTE — Progress Notes (Signed)
Established Patient Office Visit  Subjective:  Patient ID: Curtis Rodriguez, male    DOB: 1965/07/16  Age: 54 y.o. MRN: [id number]  CC:  Chief Complaint  Patient presents with  . Annual Exam    HPI Curtis Rodriguez presents for annual physical exam. He reports this is first medical exam since onset of Covid pandemic. Denies major concerns. Has tenderness in left palm in the area of the pad distal to the thumb that occurred a few weeks ago after lifting weights. Symptoms have improved slightly without therapy. He is using a glove to assist with weight lifting. Wants to get annual lab work completed and needs referral for colonoscopy postponed from June 2020 for family hx of colon cancer in father.  Past Medical History:  Diagnosis Date  . Anxiety state, unspecified 01/25/2009  . BACK PAIN, UPPER 06/04/2010  . FATIGUE 11/07/2009  . HYPERLIPIDEMIA 01/15/2009   no meds  . HYPERTHYROIDISM 11/14/2009  . LOW BLOOD PRESSURE 11/07/2009  . PREDIABETES 01/25/2009  . REACTIVE AIRWAY DISEASE 03/19/2009    Past Surgical History:  Procedure Laterality Date  . COLONOSCOPY  2009   Unsure per pt.    Family History  Problem Relation Age of Onset  . Diabetes Mother   . Hypertension Mother     Social History   Socioeconomic History  . Marital status: Married    Spouse name: Not on file  . Number of children: Not on file  . Years of education: Not on file  . Highest education level: Not on file  Occupational History  . Not on file  Tobacco Use  . Smoking status: Light Tobacco Smoker    Packs/day: 0.80    Years: 22.00    Pack years: 17.60    Types: Cigars  . Smokeless tobacco: Never Used  . Tobacco comment: OCCASIONAL CIGAR.  Substance and Sexual Activity  . Alcohol use: Yes    Comment: RARE  . Drug use: Not Currently  . Sexual activity: Not on file  Other Topics Concern  . Not on file  Social History Narrative  . Not on file   Social Determinants of Health   Financial Resource Strain:     . Difficulty of Paying Living Expenses: Not on file  Food Insecurity:   . Worried About Charity fundraiser in the Last Year: Not on file  . Ran Out of Food in the Last Year: Not on file  Transportation Needs:   . Lack of Transportation (Medical): Not on file  . Lack of Transportation (Non-Medical): Not on file  Physical Activity:   . Days of Exercise per Week: Not on file  . Minutes of Exercise per Session: Not on file  Stress:   . Feeling of Stress : Not on file  Social Connections:   . Frequency of Communication with Friends and Family: Not on file  . Frequency of Social Gatherings with Friends and Family: Not on file  . Attends Religious Services: Not on file  . Active Member of Clubs or Organizations: Not on file  . Attends Archivist Meetings: Not on file  . Marital Status: Not on file  Intimate Partner Violence:   . Fear of Current or Ex-Partner: Not on file  . Emotionally Abused: Not on file  . Physically Abused: Not on file  . Sexually Abused: Not on file    Outpatient Medications Prior to Visit  Medication Sig Dispense Refill  . albuterol (PROAIR HFA) 108 (90 Base) MCG/ACT inhaler  Inhale 2 puffs into the lungs every 4 (four) hours as needed for wheezing or shortness of breath. 18 g 0  . loratadine (CLARITIN) 10 MG tablet Take 1 tablet (10 mg total) by mouth daily. 30 tablet 11  . Multiple Vitamins-Minerals (MULTIVITAMIN ADULT PO) Take 1 tablet by mouth daily.    . vitamin B-12 (CYANOCOBALAMIN) 100 MCG tablet Take 100 mcg by mouth daily.    Marland Kitchen azithromycin (ZITHROMAX Z-PAK) 250 MG tablet As directed 6 each 0   No facility-administered medications prior to visit.    Allergies  Allergen Reactions  . Sulfites Rash    Sulfates/sulfites    ROS Review of Systems    Objective:    Physical Exam  Constitutional: He is oriented to person, place, and time. He appears well-developed and well-nourished.  HENT:  Head: Normocephalic and atraumatic.  Right  Ear: External ear normal.  Left Ear: External ear normal.  Nose: Nose normal.  Mouth/Throat: Oropharynx is clear and moist.  Eyes: Pupils are equal, round, and reactive to light. Conjunctivae and EOM are normal.  Neck: No JVD present.  Cardiovascular: Normal rate, regular rhythm and normal heart sounds. Exam reveals no gallop and no friction rub.  No murmur heard. Pulmonary/Chest: Effort normal and breath sounds normal. He exhibits no tenderness.  Abdominal: Soft. Bowel sounds are normal. He exhibits no distension and no mass. There is no abdominal tenderness. There is no rebound and no guarding.  Musculoskeletal:        General: Normal range of motion.     Left hand: Tenderness present. No swelling, deformity or bony tenderness. Normal range of motion. Normal strength.       Hands:     Cervical back: Normal range of motion and neck supple.  Lymphadenopathy:    He has no cervical adenopathy.  Neurological: He is alert and oriented to person, place, and time.  Skin: Skin is warm and dry. No rash noted. No erythema.  Psychiatric: He has a normal mood and affect. His behavior is normal. Judgment and thought content normal.    BP 102/68 (BP Location: Left Arm, Patient Position: Sitting, Cuff Size: Normal)   Pulse (!) 57   Temp (!) 97.2 F (36.2 C) (Temporal)   Ht 5\' 8"  (1.727 m)   Wt 160 lb (72.6 kg)   SpO2 97%   BMI 24.33 kg/m  Wt Readings from Last 3 Encounters:  07/05/19 160 lb (72.6 kg)  04/21/19 157 lb (71.2 kg)  06/24/18 163 lb 6.4 oz (74.1 kg)     There are no preventive care reminders to display for this patient.  There are no preventive care reminders to display for this patient.  Lab Results  Component Value Date   TSH 1.54 06/20/2018   Lab Results  Component Value Date   WBC 3.9 (L) 06/20/2018   HGB 14.3 06/20/2018   HCT 43.3 06/20/2018   MCV 88.1 06/20/2018   PLT 172.0 06/20/2018   Lab Results  Component Value Date   NA 138 06/20/2018   K 4.4  06/20/2018   CO2 32 06/20/2018   GLUCOSE 90 06/20/2018   BUN 15 06/20/2018   CREATININE 0.97 06/20/2018   BILITOT 0.4 06/20/2018   ALKPHOS 40 06/20/2018   AST 20 06/20/2018   ALT 19 06/20/2018   PROT 6.9 06/20/2018   ALBUMIN 4.3 06/20/2018   CALCIUM 9.4 06/20/2018   GFR 81.70 06/20/2018   Lab Results  Component Value Date   CHOL 231 (H) 06/20/2018  Lab Results  Component Value Date   HDL 49.50 06/20/2018   Lab Results  Component Value Date   LDLCALC 162 (H) 06/20/2018   Lab Results  Component Value Date   TRIG 95.0 06/20/2018   Lab Results  Component Value Date   CHOLHDL 5 06/20/2018   Lab Results  Component Value Date   HGBA1C 6.2 12/01/2012      Assessment & Plan:   Problem List Items Addressed This Visit    None    Visit Diagnoses    Physical exam    -  Primary   Relevant Orders   Basic metabolic panel   Lipid panel   CBC with Differential/Platelet   TSH   Hepatic function panel   PSA   Screening for malignant neoplasm of colon       Relevant Orders   Ambulatory referral to Gastroenterology      Meds ordered this encounter  Medications  . dextromethorphan-guaiFENesin (MUCINEX DM) 30-600 MG 12hr tablet    Sig: Take 1 tablet by mouth 2 (two) times daily.    Dispense:  30 tablet    Refill:  0  . azithromycin (ZITHROMAX Z-PAK) 250 MG tablet    Sig: As directed    Dispense:  6 each    Refill:  0   ASSESSMENT: 1. Left hand pain: no deformity or tenderness on physical exam. Suspect muscular injury.    PLAN: 1. Left hand pain: advised continued care with activity, progress as tolerated.  2. Referral to Gastroenterology for 5 year colonoscopy due to family hx colon cancer in father. 3. Routine lab work ordered today for PSA, TSH, Lipid, Liver, Basic metabolic panel, CBC   Follow-up: No follow-ups on file.   Emmaline Life, RN   Agree with note above.  He is due for repeat colonoscopy and referral is placed.  [name]  MD [facility]

## 2019-09-19 ENCOUNTER — Other Ambulatory Visit: Payer: Self-pay | Admitting: Family Medicine

## 2019-09-19 NOTE — Telephone Encounter (Signed)
Pt stated he broke his inhaler and needs a new albuterol inhaler (Teva brand) Pt states he is wheezing right now and needs this sent in asap.   Medication Refill: Albuterol inhaler Pharmacy: [facility]: [phone]

## 2019-10-02 ENCOUNTER — Other Ambulatory Visit: Payer: Self-pay

## 2019-10-03 ENCOUNTER — Other Ambulatory Visit (INDEPENDENT_AMBULATORY_CARE_PROVIDER_SITE_OTHER): Payer: BC Managed Care – PPO

## 2019-10-03 ENCOUNTER — Other Ambulatory Visit: Payer: Self-pay

## 2019-10-03 DIAGNOSIS — R7309 Other abnormal glucose: Secondary | ICD-10-CM | POA: Diagnosis not present

## 2019-10-03 LAB — BASIC METABOLIC PANEL
BUN: 12 mg/dL (ref 6–23)
CO2: 29 mEq/L (ref 19–32)
Calcium: 8.8 mg/dL (ref 8.4–10.5)
Chloride: 104 mEq/L (ref 96–112)
Creatinine, Ser: 1.1 mg/dL (ref 0.40–1.50)
GFR: 70.3 mL/min (ref >60.00)
Glucose, Bld: 97 mg/dL (ref 70–99)
Potassium: 3.9 mEq/L (ref 3.5–5.1)
Sodium: 138 mEq/L (ref 135–145)

## 2019-10-03 LAB — HEMOGLOBIN A1C: Hgb A1c MFr Bld: 6 % (ref 4.6–6.5)

## 2019-10-12 ENCOUNTER — Other Ambulatory Visit: Payer: Self-pay | Admitting: Family Medicine

## 2020-01-03 ENCOUNTER — Telehealth (INDEPENDENT_AMBULATORY_CARE_PROVIDER_SITE_OTHER): Payer: BC Managed Care – PPO | Admitting: Family Medicine

## 2020-01-03 DIAGNOSIS — R0989 Other specified symptoms and signs involving the circulatory and respiratory systems: Secondary | ICD-10-CM

## 2020-01-03 NOTE — Progress Notes (Signed)
Patient ID: Curtis Rodriguez, male   DOB: Jun 30, 1965, 54 y.o.   MRN: [id number]   This visit type was conducted due to national recommendations for restrictions regarding the COVID-19 pandemic in an effort to limit this patient's exposure and mitigate transmission in our community.   Virtual Visit via Video Note  I connected with Curtis Rodriguez on 01/03/20 at  4:30 PM EDT by a video enabled telemedicine application and verified that I am speaking with the correct person using two identifiers.  Location patient: home Location provider:work or home office Persons participating in the virtual visit: patient, provider  I discussed the limitations of evaluation and management by telemedicine and the availability of in person appointments. The patient expressed understanding and agreed to proceed.   HPI:  Curtis Rodriguez relates onset 8 days ago of symptoms of some mild diarrhea and fatigue.  He developed some body aches mild shortness of breath, intermittent sweats and chills but no documented fever.  He was in [place] on vacation August 3-12.  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  He has not had any specific sick contacts.  No loss of taste or smell.  No rash.  No dysuria.  Minimal cough.  Pulse oximetry today during visit 98%.  He did have Pfizer Covid vaccine several months ago.  Generally very healthy.  Takes no regular medications.  ROS: See pertinent positives and negatives per HPI.  Past Medical History:  Diagnosis Date  . Anxiety state, unspecified 01/25/2009  . BACK PAIN, UPPER 06/04/2010  . FATIGUE 11/07/2009  . HYPERLIPIDEMIA 01/15/2009   no meds  . HYPERTHYROIDISM 11/14/2009  . LOW BLOOD PRESSURE 11/07/2009  . PREDIABETES 01/25/2009  . REACTIVE AIRWAY DISEASE 03/19/2009    Past Surgical History:  Procedure Laterality Date  . COLONOSCOPY  2009   Unsure per pt.    Family History  Problem Relation Age of Onset  . Diabetes Mother   . Hypertension Mother     SOCIAL HX: Non-smoker   Current  Outpatient Medications:  .  albuterol (VENTOLIN HFA) 108 (90 Base) MCG/ACT inhaler, INHALE 2 PUFFS BY MOUTH EVERY 4 HOURS AS NEEDED FOR WHEEZING OR SHORTNESS OF BREATH**INS PROAIR, Disp: 18 g, Rfl: 1 .  azithromycin (ZITHROMAX Z-PAK) 250 MG tablet, As directed, Disp: 6 each, Rfl: 0 .  dextromethorphan-guaiFENesin (MUCINEX DM) 30-600 MG 12hr tablet, Take 1 tablet by mouth 2 (two) times daily., Disp: 30 tablet, Rfl: 0 .  loratadine (CLARITIN) 10 MG tablet, Take 1 tablet (10 mg total) by mouth daily., Disp: 30 tablet, Rfl: 11 .  Multiple Vitamins-Minerals (MULTIVITAMIN ADULT PO), Take 1 tablet by mouth daily., Disp: , Rfl:  .  vitamin B-12 (CYANOCOBALAMIN) 100 MCG tablet, Take 100 mcg by mouth daily., Disp: , Rfl:   EXAM:  VITALS per patient if applicable:  GENERAL: alert, oriented, appears well and in no acute distress  HEENT: atraumatic, conjunttiva clear, no obvious abnormalities on inspection of external nose and ears  NECK: normal movements of the head and neck  LUNGS: on inspection no signs of respiratory distress, breathing rate appears normal, no obvious gross SOB, gasping or wheezing  CV: no obvious cyanosis  MS: moves all visible extremities without noticeable abnormality  PSYCH/NEURO: pleasant and cooperative, no obvious depression or anxiety, speech and thought processing grossly intact  ASSESSMENT AND PLAN:  Discussed the following assessment and plan:  Patient presents with respiratory symptoms of mild congestion and nonspecific symptoms of body aches, fatigue, mild dyspnea, chills.  He is  in no respiratory distress.  Has received Covid vaccine.  Question is whether he has breakthrough Covid case with relatively mild symptoms.  -We recommended he go ahead and get Covid tested and he will try to do this tomorrow -Continue plenty of fluids and rest and continue isolation minimum of 10 days from onset of symptoms -Follow-up immediately for any increased shortness of breath  or other concerns     I discussed the assessment and treatment plan with the patient. The patient was provided an opportunity to ask questions and all were answered. The patient agreed with the plan and demonstrated an understanding of the instructions.   The patient was advised to call back or seek an in-person evaluation if the symptoms worsen or if the condition fails to improve as anticipated.     Carolann Littler, MD

## 2020-01-04 DIAGNOSIS — Z20822 Contact with and (suspected) exposure to covid-19: Secondary | ICD-10-CM | POA: Diagnosis not present

## 2020-01-12 ENCOUNTER — Encounter: Payer: Self-pay | Admitting: Family Medicine

## 2020-01-12 ENCOUNTER — Telehealth (INDEPENDENT_AMBULATORY_CARE_PROVIDER_SITE_OTHER): Payer: BC Managed Care – PPO | Admitting: Family Medicine

## 2020-01-12 ENCOUNTER — Other Ambulatory Visit: Payer: BC Managed Care – PPO

## 2020-01-12 ENCOUNTER — Other Ambulatory Visit: Payer: Self-pay

## 2020-01-12 VITALS — Ht 68.0 in | Wt 157.0 lb

## 2020-01-12 DIAGNOSIS — R5383 Other fatigue: Secondary | ICD-10-CM | POA: Diagnosis not present

## 2020-01-12 DIAGNOSIS — R42 Dizziness and giddiness: Secondary | ICD-10-CM

## 2020-01-12 NOTE — Progress Notes (Signed)
Patient ID: Curtis Rodriguez, male   DOB: 08/08/1965, 54 y.o.   MRN: [id number]   This visit type was conducted due to national recommendations for restrictions regarding the COVID-19 pandemic in an effort to limit this patient's exposure and mitigate transmission in our community.   Virtual Visit via Video Note  I connected with Curtis Rodriguez on 01/12/20 at  1:30 PM EDT by a video enabled telemedicine application and verified that I am speaking with the correct person using two identifiers.  Location patient: home Location provider:work or home office Persons participating in the virtual visit: patient, provider  I discussed the limitations of evaluation and management by telemedicine and the availability of in person appointments. The patient expressed understanding and agreed to proceed.   HPI: Curtis Rodriguez called with some persistent fatigue symptoms.  Refer to prior note for details.  He did go for Covid PCR testing which came back negative.  He has not had any fever.  He states he also has some dizziness with head movement.  He states this is somewhat different from vertigo but does feel like there is some sense of instability when he turns his head to the right left side.  No syncope.  No chest pain.  No leg or calf pain.  Has had some mild shortness of breath with activity but walking fairly well.  No pleuritic pain.  Previous myalgias have resolved.  No arthralgias.  No skin rash.  Vital signs reported at this time include temperature 96.9, pulse oximetry 99%, pulse 59, weight 159 pounds   ROS: See pertinent positives and negatives per HPI.  Past Medical History:  Diagnosis Date  . Anxiety state, unspecified 01/25/2009  . BACK PAIN, UPPER 06/04/2010  . FATIGUE 11/07/2009  . HYPERLIPIDEMIA 01/15/2009   no meds  . HYPERTHYROIDISM 11/14/2009  . LOW BLOOD PRESSURE 11/07/2009  . PREDIABETES 01/25/2009  . REACTIVE AIRWAY DISEASE 03/19/2009    Past Surgical History:  Procedure Laterality Date  .  COLONOSCOPY  2009   Unsure per pt.    Family History  Problem Relation Age of Onset  . Diabetes Mother   . Hypertension Mother     SOCIAL HX: Non-smoker   Current Outpatient Medications:  .  albuterol (VENTOLIN HFA) 108 (90 Base) MCG/ACT inhaler, INHALE 2 PUFFS BY MOUTH EVERY 4 HOURS AS NEEDED FOR WHEEZING OR SHORTNESS OF BREATH**INS PROAIR, Disp: 18 g, Rfl: 1 .  azithromycin (ZITHROMAX Z-PAK) 250 MG tablet, As directed, Disp: 6 each, Rfl: 0 .  dextromethorphan-guaiFENesin (MUCINEX DM) 30-600 MG 12hr tablet, Take 1 tablet by mouth 2 (two) times daily., Disp: 30 tablet, Rfl: 0 .  loratadine (CLARITIN) 10 MG tablet, Take 1 tablet (10 mg total) by mouth daily., Disp: 30 tablet, Rfl: 11 .  Multiple Vitamins-Minerals (MULTIVITAMIN ADULT PO), Take 1 tablet by mouth daily., Disp: , Rfl:  .  vitamin B-12 (CYANOCOBALAMIN) 100 MCG tablet, Take 100 mcg by mouth daily., Disp: , Rfl:   EXAM:  VITALS per patient if applicable:  GENERAL: alert, oriented, appears well and in no acute distress  HEENT: atraumatic, conjunttiva clear, no obvious abnormalities on inspection of external nose and ears  NECK: normal movements of the head and neck  LUNGS: on inspection no signs of respiratory distress, breathing rate appears normal, no obvious gross SOB, gasping or wheezing  CV: no obvious cyanosis  MS: moves all visible extremities without noticeable abnormality  PSYCH/NEURO: pleasant and cooperative, no obvious depression or anxiety, speech and thought processing grossly intact  ASSESSMENT AND PLAN:  Discussed the following assessment and plan:  Fatigue and vague dizziness.  Suspect recent viral syndrome.  Recent Covid testing negative.  -Check urinalysis, sed rate, CBC, comprehensive metabolic panel -We recommended office follow-up by early next week if symptoms not improving     I discussed the assessment and treatment plan with the patient. The patient was provided an opportunity to ask  questions and all were answered. The patient agreed with the plan and demonstrated an understanding of the instructions.   The patient was advised to call back or seek an in-person evaluation if the symptoms worsen or if the condition fails to improve as anticipated.     Carolann Littler, MD

## 2020-01-13 LAB — CBC WITH DIFFERENTIAL/PLATELET
Absolute Monocytes: 655 cells/uL (ref 200–950)
Basophils Absolute: 20 cells/uL (ref 0–200)
Basophils Relative: 0.4 %
Eosinophils Absolute: 230 cells/uL (ref 15–500)
Eosinophils Relative: 4.6 %
HCT: 41 % (ref 38.5–50.0)
Hemoglobin: 13.5 g/dL (ref 13.2–17.1)
Lymphs Abs: 1785 cells/uL (ref 850–3900)
MCH: 28.4 pg (ref 27.0–33.0)
MCHC: 32.9 g/dL (ref 32.0–36.0)
MCV: 86.3 fL (ref 80.0–100.0)
MPV: 11.8 fL (ref 7.5–12.5)
Monocytes Relative: 13.1 %
Neutro Abs: 2310 cells/uL (ref 1500–7800)
Neutrophils Relative %: 46.2 %
Platelets: 254 10*3/uL (ref 140–400)
RBC: 4.75 10*6/uL (ref 4.20–5.80)
RDW: 12 % (ref 11.0–15.0)
Total Lymphocyte: 35.7 %
WBC: 5 10*3/uL (ref 3.8–10.8)

## 2020-01-13 LAB — COMPREHENSIVE METABOLIC PANEL
AG Ratio: 1.5 (calc) (ref 1.0–2.5)
ALT: 67 U/L — ABNORMAL HIGH (ref 9–46)
AST: 36 U/L — ABNORMAL HIGH (ref 10–35)
Albumin: 3.9 g/dL (ref 3.6–5.1)
Alkaline phosphatase (APISO): 59 U/L (ref 35–144)
BUN: 13 mg/dL (ref 7–25)
CO2: 26 mmol/L (ref 20–32)
Calcium: 9.3 mg/dL (ref 8.6–10.3)
Chloride: 105 mmol/L (ref 98–110)
Creat: 0.93 mg/dL (ref 0.70–1.33)
Globulin: 2.6 g/dL (calc) (ref 1.9–3.7)
Glucose, Bld: 110 mg/dL — ABNORMAL HIGH (ref 65–99)
Potassium: 4.5 mmol/L (ref 3.5–5.3)
Sodium: 138 mmol/L (ref 135–146)
Total Bilirubin: 0.4 mg/dL (ref 0.2–1.2)
Total Protein: 6.5 g/dL (ref 6.1–8.1)

## 2020-01-13 LAB — SEDIMENTATION RATE: Sed Rate: 29 mm/h — ABNORMAL HIGH (ref 0–20)

## 2020-01-13 LAB — URINALYSIS
Bilirubin Urine: NEGATIVE
Glucose, UA: NEGATIVE
Hgb urine dipstick: NEGATIVE
Ketones, ur: NEGATIVE
Leukocytes,Ua: NEGATIVE
Nitrite: NEGATIVE
Protein, ur: NEGATIVE
Specific Gravity, Urine: 1.02 (ref 1.001–1.03)
pH: 6 (ref 5.0–8.0)

## 2020-01-24 ENCOUNTER — Telehealth (INDEPENDENT_AMBULATORY_CARE_PROVIDER_SITE_OTHER): Payer: BC Managed Care – PPO | Admitting: Family Medicine

## 2020-01-24 ENCOUNTER — Telehealth: Payer: BC Managed Care – PPO | Admitting: Family Medicine

## 2020-01-24 DIAGNOSIS — R0609 Other forms of dyspnea: Secondary | ICD-10-CM

## 2020-01-24 DIAGNOSIS — R0600 Dyspnea, unspecified: Secondary | ICD-10-CM

## 2020-01-24 NOTE — Progress Notes (Signed)
Patient ID: Curtis Rodriguez, male   DOB: 1965-11-17, 54 y.o.   MRN: [id number]   This visit type was conducted due to national recommendations for restrictions regarding the COVID-19 pandemic in an effort to limit this patient's exposure and mitigate transmission in our community.   Virtual Visit via Video Note  I connected with Curtis Rodriguez on 01/24/20 at  4:45 PM EDT by a video enabled telemedicine application and verified that I am speaking with the correct person using two identifiers.  Location patient: home Location provider:work or home office Persons participating in the virtual visit: patient, provider  I discussed the limitations of evaluation and management by telemedicine and the availability of in person appointments. The patient expressed understanding and agreed to proceed.   HPI: Curtis Rodriguez called to follow-up regarding recent respiratory symptoms.  They have improved overall.  He had Covid testing which was negative.  He initially had some shortness of breath, body aches, diarrhea, diaphoresis, chills but no documented fever.  This occurred after recent vacation to [place].  He really has had essentially no cough.  Pulse oximetry's have remained good throughout (and 97% today).  His fatigue and headache have resolved.  He actually started running some again last week and went 7 miles followed by 5 a couple of days.  He was able to run several miles but had some increased shortness of breath above his baseline.  His day-to-day breathing is good.  No chest pain.  No pleuritic pain.  No chest tightness.  We did some recent labs including sed rate, CBC, comprehensive metabolic panel and these were basically normal.  No concerning findings.  He did smoke back in his 58s into his 8s with cigarettes.  Only rare cigar use now.   ROS: See pertinent positives and negatives per HPI.  Past Medical History:  Diagnosis Date  . Anxiety state, unspecified 01/25/2009  . BACK PAIN, UPPER 06/04/2010  . FATIGUE  11/07/2009  . HYPERLIPIDEMIA 01/15/2009   no meds  . HYPERTHYROIDISM 11/14/2009  . LOW BLOOD PRESSURE 11/07/2009  . PREDIABETES 01/25/2009  . REACTIVE AIRWAY DISEASE 03/19/2009    Past Surgical History:  Procedure Laterality Date  . COLONOSCOPY  2009   Unsure per pt.    Family History  Problem Relation Age of Onset  . Diabetes Mother   . Hypertension Mother     SOCIAL HX: Smoking history back in his 73s and 85s   Current Outpatient Medications:  .  albuterol (VENTOLIN HFA) 108 (90 Base) MCG/ACT inhaler, INHALE 2 PUFFS BY MOUTH EVERY 4 HOURS AS NEEDED FOR WHEEZING OR SHORTNESS OF BREATH**INS PROAIR, Disp: 18 g, Rfl: 1 .  azithromycin (ZITHROMAX Z-PAK) 250 MG tablet, As directed, Disp: 6 each, Rfl: 0 .  dextromethorphan-guaiFENesin (MUCINEX DM) 30-600 MG 12hr tablet, Take 1 tablet by mouth 2 (two) times daily., Disp: 30 tablet, Rfl: 0 .  loratadine (CLARITIN) 10 MG tablet, Take 1 tablet (10 mg total) by mouth daily., Disp: 30 tablet, Rfl: 11 .  Multiple Vitamins-Minerals (MULTIVITAMIN ADULT PO), Take 1 tablet by mouth daily., Disp: , Rfl:  .  vitamin B-12 (CYANOCOBALAMIN) 100 MCG tablet, Take 100 mcg by mouth daily., Disp: , Rfl:   EXAM:  VITALS per patient if applicable:  GENERAL: alert, oriented, appears well and in no acute distress  HEENT: atraumatic, conjunttiva clear, no obvious abnormalities on inspection of external nose and ears  NECK: normal movements of the head and neck  LUNGS: on inspection no signs of respiratory distress, breathing  rate appears normal, no obvious gross SOB, gasping or wheezing  CV: no obvious cyanosis  MS: moves all visible extremities without noticeable abnormality  PSYCH/NEURO: pleasant and cooperative, no obvious depression or anxiety, speech and thought processing grossly intact  ASSESSMENT AND PLAN:  Discussed the following assessment and plan:  Exertional dyspnea - Plan: DG Chest 2 View   Recent respiratory symptoms with Covid  testing negative.  His respiratory symptoms have improved.  He has some mild exertional dyspnea but no chest pain.  Question is whether this is some deconditioning from recent illness.  He had taken several weeks off running and is now trying to build back.  He does not have any pleuritic pain, hypoxia, or other concerning features for pulmonary embolus.  Pulse oximetry today 97%  -We discussed getting chest x-ray to make sure no evidence for cardiomyopathy or other abnormality.  This was ordered and he will walk-in for that. -Slowly progress running over the next couple weeks and if not progressing as expected consider echocardiogram and further evaluation in office with EKG     I discussed the assessment and treatment plan with the patient. The patient was provided an opportunity to ask questions and all were answered. The patient agreed with the plan and demonstrated an understanding of the instructions.   The patient was advised to call back or seek an in-person evaluation if the symptoms worsen or if the condition fails to improve as anticipated.     Carolann Littler, MD

## 2020-02-01 ENCOUNTER — Other Ambulatory Visit: Payer: Self-pay

## 2020-02-01 ENCOUNTER — Ambulatory Visit (INDEPENDENT_AMBULATORY_CARE_PROVIDER_SITE_OTHER)
Admission: RE | Admit: 2020-02-01 | Discharge: 2020-02-01 | Disposition: A | Discharge status: Home / Self Care | Payer: BC Managed Care – PPO | Source: Ambulatory Visit | Attending: Family Medicine | Admitting: Family Medicine

## 2020-02-01 DIAGNOSIS — R0600 Dyspnea, unspecified: Secondary | ICD-10-CM

## 2020-02-01 DIAGNOSIS — R0602 Shortness of breath: Secondary | ICD-10-CM | POA: Diagnosis not present

## 2020-02-01 DIAGNOSIS — R0609 Other forms of dyspnea: Secondary | ICD-10-CM

## 2020-03-29 ENCOUNTER — Telehealth: Payer: Self-pay | Admitting: Family Medicine

## 2020-03-29 MED ORDER — AZELASTINE-FLUTICASONE 137-50 MCG/ACT NA SUSP: 1.0000 | Freq: Two times a day (BID) | NASAL | 5 refills | Status: DC

## 2020-03-29 NOTE — Telephone Encounter (Signed)
No longer needed

## 2020-03-29 NOTE — Telephone Encounter (Signed)
Pt is calling in wanting a Rx refilled that is not on his medication list Rx dymista nasal spray (was prescribed by a Dr. Leonides Schanz and he is not sure where that provider is and would like to see if Dr. Elease Hashimoto would refill it)   Pharm:  [facility].

## 2020-03-29 NOTE — Telephone Encounter (Signed)
Okay to send in 

## 2020-03-29 NOTE — Telephone Encounter (Signed)
OK to send in [place] one actuation in each  nostril bid  with 5 refills.

## 2020-03-29 NOTE — Telephone Encounter (Signed)
Rx sent in

## 2020-06-05 ENCOUNTER — Telehealth: Payer: Self-pay

## 2020-06-05 NOTE — Telephone Encounter (Signed)
Prior authorization was submitted for Azelastine-Fluticasone 137-50 mcg. Denied due to patient hasn't tried Flonase nasal spray.   Can a script be sent to pharmacy for generic Flonase nasal spray?

## 2020-06-06 ENCOUNTER — Other Ambulatory Visit: Payer: Self-pay

## 2020-06-06 MED ORDER — FLUTICASONE PROPIONATE 50 MCG/ACT NA SUSP: 2.0000 | Freq: Every day | NASAL | 3 refills | Status: DC

## 2020-06-06 NOTE — Telephone Encounter (Signed)
OK to send in Flonase - take 1-2 sprays per nostril once daily

## 2020-06-06 NOTE — Telephone Encounter (Signed)
[facility]

## 2020-07-03 ENCOUNTER — Encounter: Payer: Self-pay | Admitting: Family Medicine

## 2020-07-03 ENCOUNTER — Telehealth: Payer: BC Managed Care – PPO | Admitting: Family Medicine

## 2020-07-03 ENCOUNTER — Ambulatory Visit: Payer: BC Managed Care – PPO | Admitting: Family Medicine

## 2020-07-03 ENCOUNTER — Other Ambulatory Visit: Payer: Self-pay

## 2020-07-03 VITALS — BP 122/88 | HR 67 | Ht 68.0 in | Wt 170.0 lb

## 2020-07-03 DIAGNOSIS — R21 Rash and other nonspecific skin eruption: Secondary | ICD-10-CM | POA: Diagnosis not present

## 2020-07-03 MED ORDER — TRIAMCINOLONE ACETONIDE 0.1 % EX CREA: 1.0000 "application " | TOPICAL_CREAM | Freq: Two times a day (BID) | CUTANEOUS | 1 refills | Status: AC

## 2020-07-03 NOTE — Patient Instructions (Signed)
Let me know if rash not clearing in next two weeks.

## 2020-07-03 NOTE — Progress Notes (Unsigned)
Established Patient Office Visit  Subjective:  Patient ID: Curtis Rodriguez, male    DOB: [birth year]  Age: 55 y.o. MRN: [id number]  CC:  Chief Complaint  Patient presents with  . Rash    HPI Curtis Rodriguez presents for evaluation of skin rash cheek region bilaterally.  This started about 3 weeks ago.  Nonpruritic.  Nonpainful.  Slightly dry and scaly.  History is that he was up in [place] about 3 weeks ago.  He states the temperatures were 1 F and he was out in those temperatures for about 15 or 20 minutes.  Rash may have started shortly afterwards. He tried some over-the-counter 1% hydrocortisone cream without much change.  He is not noted any generalized rash.  Past Medical History:  Diagnosis Date  . Anxiety state, unspecified 01/25/2009  . BACK PAIN, UPPER 06/04/2010  . FATIGUE 11/07/2009  . HYPERLIPIDEMIA 01/15/2009   no meds  . HYPERTHYROIDISM 11/14/2009  . LOW BLOOD PRESSURE 11/07/2009  . PREDIABETES 01/25/2009  . REACTIVE AIRWAY DISEASE 03/19/2009    Past Surgical History:  Procedure Laterality Date  . COLONOSCOPY  2009   Unsure per pt.    Family History  Problem Relation Age of Onset  . Diabetes Mother   . Hypertension Mother     Social History   Socioeconomic History  . Marital status: Married    Spouse name: Not on file  . Number of children: Not on file  . Years of education: Not on file  . Highest education level: Not on file  Occupational History  . Not on file  Tobacco Use  . Smoking status: Light Tobacco Smoker    Packs/day: 0.80    Years: 22.00    Pack years: 17.60    Types: Cigars  . Smokeless tobacco: Never Used  . Tobacco comment: OCCASIONAL CIGAR.  Vaping Use  . Vaping Use: Never used  Substance and Sexual Activity  . Alcohol use: Yes    Comment: RARE  . Drug use: Not Currently  . Sexual activity: Not on file  Other Topics Concern  . Not on file  Social History Narrative  . Not on file   Social Determinants of Health   Financial Resource  Strain: Not on file  Food Insecurity: Not on file  Transportation Needs: Not on file  Physical Activity: Not on file  Stress: Not on file  Social Connections: Not on file  Intimate Partner Violence: Not on file    Outpatient Medications Prior to Visit  Medication Sig Dispense Refill  . albuterol (VENTOLIN HFA) 108 (90 Base) MCG/ACT inhaler INHALE 2 PUFFS BY MOUTH EVERY 4 HOURS AS NEEDED FOR WHEEZING OR SHORTNESS OF BREATH**INS PROAIR 18 g 1  . dextromethorphan-guaiFENesin (MUCINEX DM) 30-600 MG 12hr tablet Take 1 tablet by mouth 2 (two) times daily. 30 tablet 0  . fluticasone (FLONASE) 50 MCG/ACT nasal spray Place 2 sprays into both nostrils daily. Place one to two sprays in nostrils daily 16 g 3  . loratadine (CLARITIN) 10 MG tablet Take 1 tablet (10 mg total) by mouth daily. 30 tablet 11  . Multiple Vitamins-Minerals (MULTIVITAMIN ADULT PO) Take 1 tablet by mouth daily.    . vitamin B-12 (CYANOCOBALAMIN) 100 MCG tablet Take 100 mcg by mouth daily.    Marland Kitchen azithromycin (ZITHROMAX Z-PAK) 250 MG tablet As directed 6 each 0   No facility-administered medications prior to visit.    Allergies  Allergen Reactions  . Sulfites Rash    Sulfates/sulfites  ROS Review of Systems  Constitutional: Negative for chills and fever.  Skin: Positive for rash.      Objective:    Physical Exam Vitals reviewed.  Constitutional:      Appearance: Normal appearance.  Cardiovascular:     Rate and Rhythm: Normal rate and regular rhythm.  Pulmonary:     Effort: Pulmonary effort is normal.     Breath sounds: Normal breath sounds.  Skin:    Findings: Rash present.     Comments: He has area of rash right and left cheek which is about 1 cm diameter.  Slightly scaly and dry.  No pustules.  Nontender  Neurological:     Mental Status: He is alert.     BP 122/88   Pulse 67   Ht 5\' 8"  (1.727 m)   Wt 170 lb (77.1 kg)   SpO2 98%   BMI 25.85 kg/m  Wt Readings from Last 3 Encounters:  07/03/20  170 lb (77.1 kg)  01/12/20 157 lb (71.2 kg)  07/05/19 160 lb (72.6 kg)     Health Maintenance Due  Topic Date Due  . Hepatitis C Screening  Never done  . COLONOSCOPY (Pts 45-62yrs Insurance coverage will need to be confirmed)  12/30/2017  . INFLUENZA VACCINE  12/10/2019    There are no preventive care reminders to display for this patient.  Lab Results  Component Value Date   TSH 2.18 07/05/2019   Lab Results  Component Value Date   WBC 5.0 01/12/2020   HGB 13.5 01/12/2020   HCT 41.0 01/12/2020   MCV 86.3 01/12/2020   PLT 254 01/12/2020   Lab Results  Component Value Date   NA 138 01/12/2020   K 4.5 01/12/2020   CO2 26 01/12/2020   GLUCOSE 110 (H) 01/12/2020   BUN 13 01/12/2020   CREATININE 0.93 01/12/2020   BILITOT 0.4 01/12/2020   ALKPHOS 38 (L) 07/05/2019   AST 36 (H) 01/12/2020   ALT 67 (H) 01/12/2020   PROT 6.5 01/12/2020   ALBUMIN 4.4 07/05/2019   CALCIUM 9.3 01/12/2020   GFR 70.30 10/03/2019   Lab Results  Component Value Date   CHOL 241 (H) 07/05/2019   Lab Results  Component Value Date   HDL 47.80 07/05/2019   Lab Results  Component Value Date   LDLCALC 174 (H) 07/05/2019   Lab Results  Component Value Date   TRIG 94.0 07/05/2019   Lab Results  Component Value Date   CHOLHDL 5 07/05/2019   Lab Results  Component Value Date   HGBA1C 6.0 10/03/2019      Assessment & Plan:   Bilateral skin rash cheeks.  This is slightly scaly.  Given history of recent cold exposure question windburn/chafing  -Recommend trial of triamcinolone 0.1% cream twice daily -Touch base if this is not clearing over the next couple weeks.  Meds ordered this encounter  Medications  . triamcinolone (KENALOG) 0.1 %    Sig: Apply 1 application topically 2 (two) times daily.    Dispense:  15 g    Refill:  1    Follow-up: No follow-ups on file.    Carolann Littler, MD

## 2020-07-04 NOTE — Progress Notes (Signed)
Virtual visit was cancelled and pt brought in office for exam

## 2020-07-17 DIAGNOSIS — Z20822 Contact with and (suspected) exposure to covid-19: Secondary | ICD-10-CM | POA: Diagnosis not present

## 2020-08-09 ENCOUNTER — Encounter: Payer: Self-pay | Admitting: Family Medicine

## 2020-08-09 ENCOUNTER — Ambulatory Visit: Payer: BC Managed Care – PPO | Admitting: Family Medicine

## 2020-08-09 ENCOUNTER — Other Ambulatory Visit: Payer: Self-pay

## 2020-08-09 VITALS — BP 110/60 | HR 68 | Temp 97.8°F | Wt 170.2 lb

## 2020-08-09 DIAGNOSIS — R21 Rash and other nonspecific skin eruption: Secondary | ICD-10-CM

## 2020-08-09 LAB — COMPREHENSIVE METABOLIC PANEL
ALT: 36 U/L (ref 0–53)
AST: 30 U/L (ref 0–37)
Albumin: 4.2 g/dL (ref 3.5–5.2)
Alkaline Phosphatase: 47 U/L (ref 39–117)
BUN: 18 mg/dL (ref 6–23)
CO2: 30 mEq/L (ref 19–32)
Calcium: 9.2 mg/dL (ref 8.4–10.5)
Chloride: 103 mEq/L (ref 96–112)
Creatinine, Ser: 1.17 mg/dL (ref 0.40–1.50)
GFR: 71.52 mL/min (ref >60.00)
Glucose, Bld: 96 mg/dL (ref 70–99)
Potassium: 3.9 mEq/L (ref 3.5–5.1)
Sodium: 141 mEq/L (ref 135–145)
Total Bilirubin: 0.4 mg/dL (ref 0.2–1.2)
Total Protein: 7 g/dL (ref 6.0–8.3)

## 2020-08-09 LAB — SEDIMENTATION RATE: Sed Rate: 21 mm/hr — ABNORMAL HIGH (ref 0–20)

## 2020-08-09 LAB — CBC WITH DIFFERENTIAL/PLATELET
Basophils Absolute: 0 10*3/uL (ref 0.0–0.1)
Basophils Relative: 0.6 % (ref 0.0–3.0)
Eosinophils Absolute: 0.2 10*3/uL (ref 0.0–0.7)
Eosinophils Relative: 3.3 % (ref 0.0–5.0)
HCT: 40.2 % (ref 39.0–52.0)
Hemoglobin: 13.5 g/dL (ref 13.0–17.0)
Lymphocytes Relative: 35.3 % (ref 12.0–46.0)
Lymphs Abs: 2 10*3/uL (ref 0.7–4.0)
MCHC: 33.6 g/dL (ref 30.0–36.0)
MCV: 86 fl (ref 78.0–100.0)
Monocytes Absolute: 0.5 10*3/uL (ref 0.1–1.0)
Monocytes Relative: 8.2 % (ref 3.0–12.0)
Neutro Abs: 3 10*3/uL (ref 1.4–7.7)
Neutrophils Relative %: 52.6 % (ref 43.0–77.0)
Platelets: 171 10*3/uL (ref 150.0–400.0)
RBC: 4.67 Mil/uL (ref 4.22–5.81)
RDW: 14.3 % (ref 11.5–15.5)
WBC: 5.8 10*3/uL (ref 4.0–10.5)

## 2020-08-09 NOTE — Progress Notes (Signed)
Established Patient Office Visit  Subjective:  Patient ID: Curtis Rodriguez, male    DOB: 27-Feb-1966  Age: 55 y.o. MRN: [id number]  CC:  Chief Complaint  Patient presents with  . Follow-up    Rash on the face, has improved but not completely gone    HPI Curtis Rodriguez presents for persistent rash predominantly on his face region.  He was seen here back in February.  He had been up in [place] and exposed to extremely cold weather.  At that point his rash looked almost more erythematous and we suspect that even potentially some injury from the extreme cold.  His rash is basically unchanged.  He has been applying some triamcinolone 0.1% cream without much change.  Denies any new rash other than a few small scaly patches on his anterior trunk.  He denies any arthralgias, fever, chills or any new systemic symptoms.  Denies any extremity rash.  No new medications.   No myalgias or muscle weakness.     Past Medical History:  Diagnosis Date  . Anxiety state, unspecified 01/25/2009  . BACK PAIN, UPPER 06/04/2010  . FATIGUE 11/07/2009  . HYPERLIPIDEMIA 01/15/2009   no meds  . HYPERTHYROIDISM 11/14/2009  . LOW BLOOD PRESSURE 11/07/2009  . PREDIABETES 01/25/2009  . REACTIVE AIRWAY DISEASE 03/19/2009    Past Surgical History:  Procedure Laterality Date  . COLONOSCOPY  2009   Unsure per pt.    Family History  Problem Relation Age of Onset  . Diabetes Mother   . Hypertension Mother     Social History   Socioeconomic History  . Marital status: Married    Spouse name: Not on file  . Number of children: Not on file  . Years of education: Not on file  . Highest education level: Not on file  Occupational History  . Not on file  Tobacco Use  . Smoking status: Light Tobacco Smoker    Packs/day: 0.80    Years: 22.00    Pack years: 17.60    Types: Cigars  . Smokeless tobacco: Never Used  . Tobacco comment: OCCASIONAL CIGAR.  Vaping Use  . Vaping Use: Never used  Substance and Sexual Activity   . Alcohol use: Yes    Comment: RARE  . Drug use: Not Currently  . Sexual activity: Not on file  Other Topics Concern  . Not on file  Social History Narrative  . Not on file   Social Determinants of Health   Financial Resource Strain: Not on file  Food Insecurity: Not on file  Transportation Needs: Not on file  Physical Activity: Not on file  Stress: Not on file  Social Connections: Not on file  Intimate Partner Violence: Not on file    Outpatient Medications Prior to Visit  Medication Sig Dispense Refill  . albuterol (VENTOLIN HFA) 108 (90 Base) MCG/ACT inhaler INHALE 2 PUFFS BY MOUTH EVERY 4 HOURS AS NEEDED FOR WHEEZING OR SHORTNESS OF BREATH**INS PROAIR 18 g 1  . dextromethorphan-guaiFENesin (MUCINEX DM) 30-600 MG 12hr tablet Take 1 tablet by mouth 2 (two) times daily. 30 tablet 0  . fluticasone (FLONASE) 50 MCG/ACT nasal spray Place 2 sprays into both nostrils daily. Place one to two sprays in nostrils daily 16 g 3  . loratadine (CLARITIN) 10 MG tablet Take 1 tablet (10 mg total) by mouth daily. 30 tablet 11  . Multiple Vitamins-Minerals (MULTIVITAMIN ADULT PO) Take 1 tablet by mouth daily.    Marland Kitchen triamcinolone (KENALOG) 0.1 % Apply 1 application  topically 2 (two) times daily. 15 g 1  . vitamin B-12 (CYANOCOBALAMIN) 100 MCG tablet Take 100 mcg by mouth daily.     No facility-administered medications prior to visit.    Allergies  Allergen Reactions  . Sulfites Rash    Sulfates/sulfites    ROS Review of Systems  Constitutional: Negative for chills and fever.  Skin: Positive for rash.  Hematological: Negative for adenopathy.      Objective:    Physical Exam Vitals reviewed.  Constitutional:      Appearance: Normal appearance.  Cardiovascular:     Rate and Rhythm: Normal rate and regular rhythm.  Pulmonary:     Effort: Pulmonary effort is normal.     Breath sounds: Normal breath sounds.  Skin:    Findings: Rash present.     Comments: macular somewhat  violaceous rash on his face with scattered patches including one on each cheek as well as right forehead region and left chin region.  These are slightly dry to palpation.  No pustules.  No vesicles.  Neurological:     Mental Status: He is alert.     BP 110/60 (BP Location: Left Arm, Patient Position: Sitting, Cuff Size: Normal)   Pulse 68   Temp 97.8 F (36.6 C) (Oral)   Wt 170 lb 3.2 oz (77.2 kg)   SpO2 99%   BMI 25.88 kg/m  Wt Readings from Last 3 Encounters:  08/09/20 170 lb 3.2 oz (77.2 kg)  07/03/20 170 lb (77.1 kg)  01/12/20 157 lb (71.2 kg)     Health Maintenance Due  Topic Date Due  . Hepatitis C Screening  Never done  . COLONOSCOPY (Pts 45-64yrs Insurance coverage will need to be confirmed)  12/30/2017    There are no preventive care reminders to display for this patient.  Lab Results  Component Value Date   TSH 2.18 07/05/2019   Lab Results  Component Value Date   WBC 5.0 01/12/2020   HGB 13.5 01/12/2020   HCT 41.0 01/12/2020   MCV 86.3 01/12/2020   PLT 254 01/12/2020   Lab Results  Component Value Date   NA 138 01/12/2020   K 4.5 01/12/2020   CO2 26 01/12/2020   GLUCOSE 110 (H) 01/12/2020   BUN 13 01/12/2020   CREATININE 0.93 01/12/2020   BILITOT 0.4 01/12/2020   ALKPHOS 38 (L) 07/05/2019   AST 36 (H) 01/12/2020   ALT 67 (H) 01/12/2020   PROT 6.5 01/12/2020   ALBUMIN 4.4 07/05/2019   CALCIUM 9.3 01/12/2020   GFR 70.30 10/03/2019   Lab Results  Component Value Date   CHOL 241 (H) 07/05/2019   Lab Results  Component Value Date   HDL 47.80 07/05/2019   Lab Results  Component Value Date   LDLCALC 174 (H) 07/05/2019   Lab Results  Component Value Date   TRIG 94.0 07/05/2019   Lab Results  Component Value Date   CHOLHDL 5 07/05/2019   Lab Results  Component Value Date   HGBA1C 6.0 10/03/2019      Assessment & Plan:   Problem List Items Addressed This Visit   None   Visit Diagnoses    Skin rash    -  Primary   Relevant  Orders   CBC with Differential/Platelet   ANA   Sedimentation rate   CMP    persistent somewhat violaceous rash in plaques scattered on the face.  No vesicles.  No pustules.  No systemic symptoms Check labs above. Set up  dermatology referral.  No orders of the defined types were placed in this encounter.   Follow-up: No follow-ups on file.    Carolann Littler, MD

## 2020-08-11 LAB — ANA: Anti Nuclear Antibody (ANA): NEGATIVE

## 2020-08-14 ENCOUNTER — Encounter: Payer: Self-pay | Admitting: Family Medicine

## 2020-08-16 ENCOUNTER — Telehealth: Payer: Self-pay | Admitting: Family Medicine

## 2020-08-16 NOTE — Telephone Encounter (Signed)
Pt return your call and want a call back.

## 2020-08-19 NOTE — Telephone Encounter (Signed)
Call was in regards to lab results. Discussed results with patient, patient expressed understanding. Nothing further needed.

## 2020-08-23 DIAGNOSIS — L218 Other seborrheic dermatitis: Secondary | ICD-10-CM | POA: Diagnosis not present

## 2020-08-23 DIAGNOSIS — L718 Other rosacea: Secondary | ICD-10-CM | POA: Diagnosis not present

## 2020-09-14 DIAGNOSIS — Z20822 Contact with and (suspected) exposure to covid-19: Secondary | ICD-10-CM | POA: Diagnosis not present

## 2020-09-16 DIAGNOSIS — U071 COVID-19: Secondary | ICD-10-CM | POA: Diagnosis not present

## 2020-09-21 DIAGNOSIS — Z20822 Contact with and (suspected) exposure to covid-19: Secondary | ICD-10-CM | POA: Diagnosis not present

## 2020-09-27 DIAGNOSIS — L249 Irritant contact dermatitis, unspecified cause: Secondary | ICD-10-CM | POA: Diagnosis not present

## 2020-10-11 DIAGNOSIS — L249 Irritant contact dermatitis, unspecified cause: Secondary | ICD-10-CM | POA: Diagnosis not present

## 2020-12-12 DIAGNOSIS — D485 Neoplasm of uncertain behavior of skin: Secondary | ICD-10-CM | POA: Diagnosis not present

## 2020-12-20 DIAGNOSIS — L989 Disorder of the skin and subcutaneous tissue, unspecified: Secondary | ICD-10-CM | POA: Diagnosis not present

## 2020-12-20 DIAGNOSIS — D485 Neoplasm of uncertain behavior of skin: Secondary | ICD-10-CM | POA: Diagnosis not present

## 2021-04-14 ENCOUNTER — Telehealth: Payer: Self-pay | Admitting: Family Medicine

## 2021-04-14 NOTE — Telephone Encounter (Signed)
Pt call and need a refill on albuterol (VENTOLIN HFA) 108 (90 Base) MCG/ACT inhaler [sent to  [id number] [address], [place] Phone:  [phone]  Fax:  [phone]

## 2021-04-15 MED ORDER — ALBUTEROL SULFATE HFA 108 (90 BASE) MCG/ACT IN AERS: INHALATION_SPRAY | RESPIRATORY_TRACT | 1 refills | Status: DC

## 2021-04-15 NOTE — Telephone Encounter (Signed)
Rx sent 

## 2021-09-10 IMAGING — DX DG CHEST 2V
2 series · 2 of 2 positions shown · non-contrast
Comparison: None.

CLINICAL DATA: Shortness of breath on exertion for

EXAM:
CHEST - 2 VIEW

[chest pa]
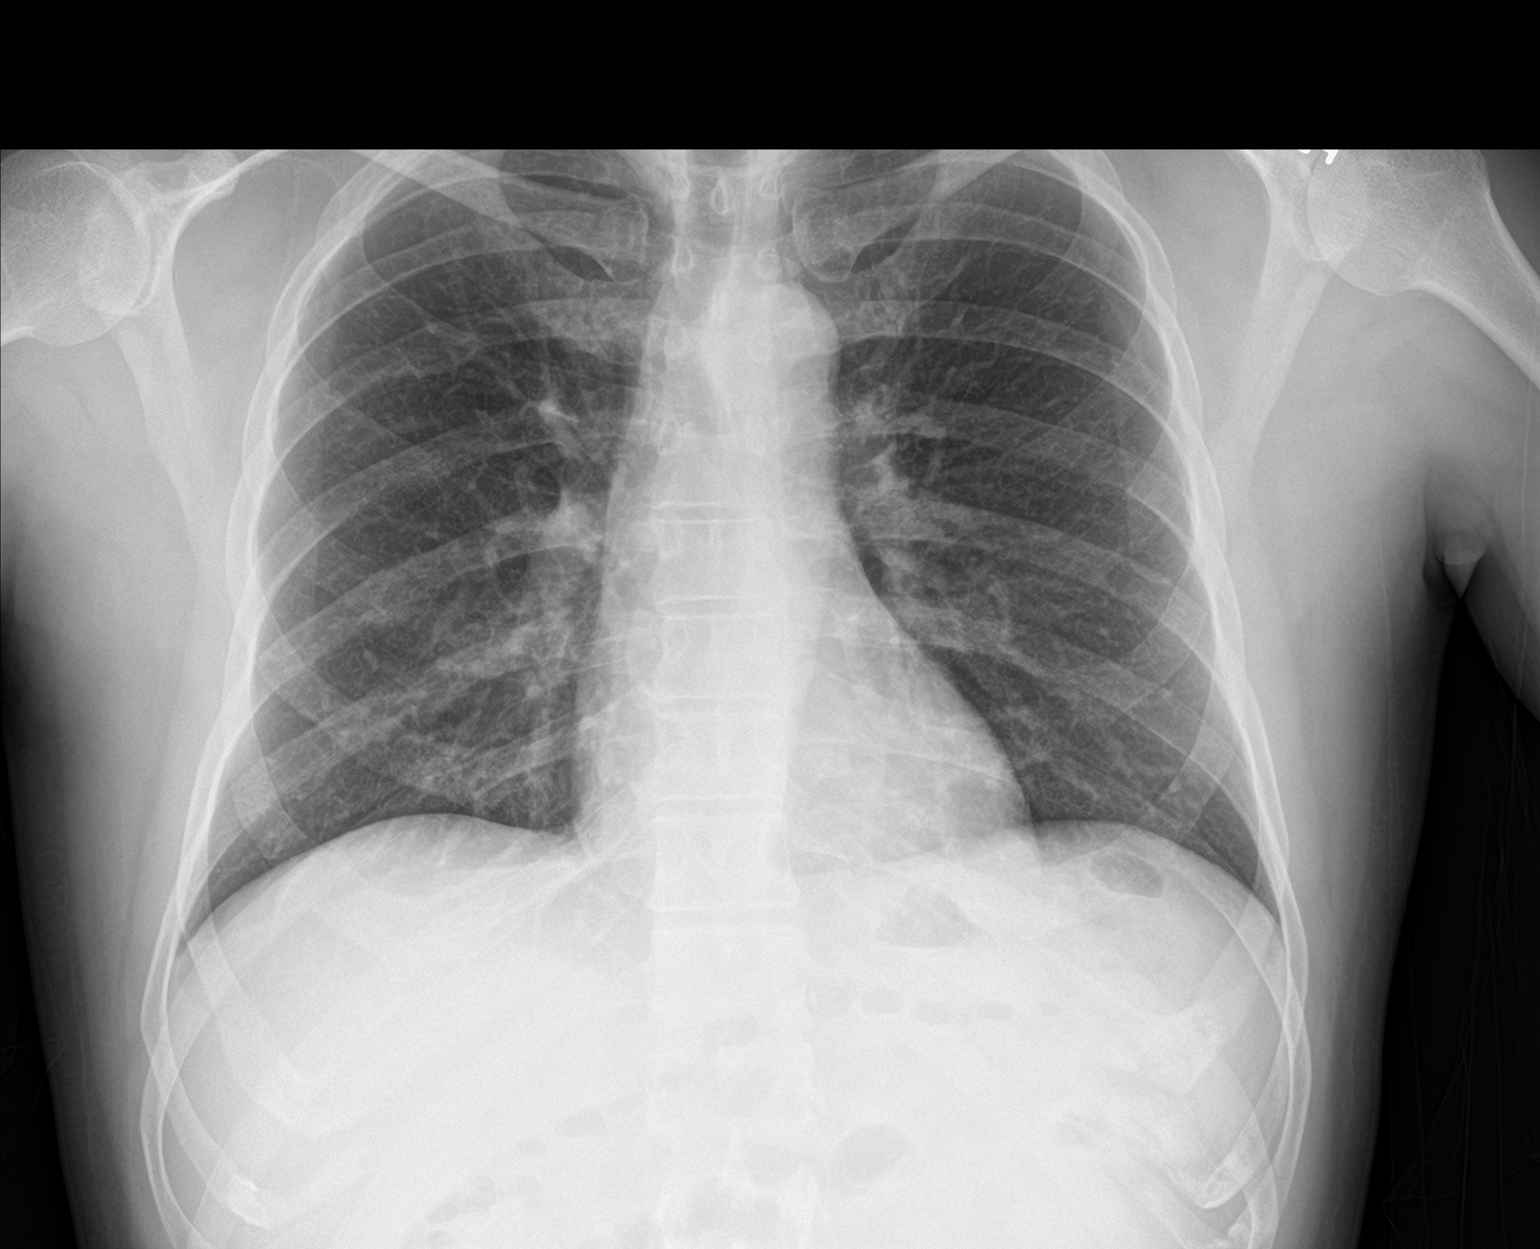

[chest lat]
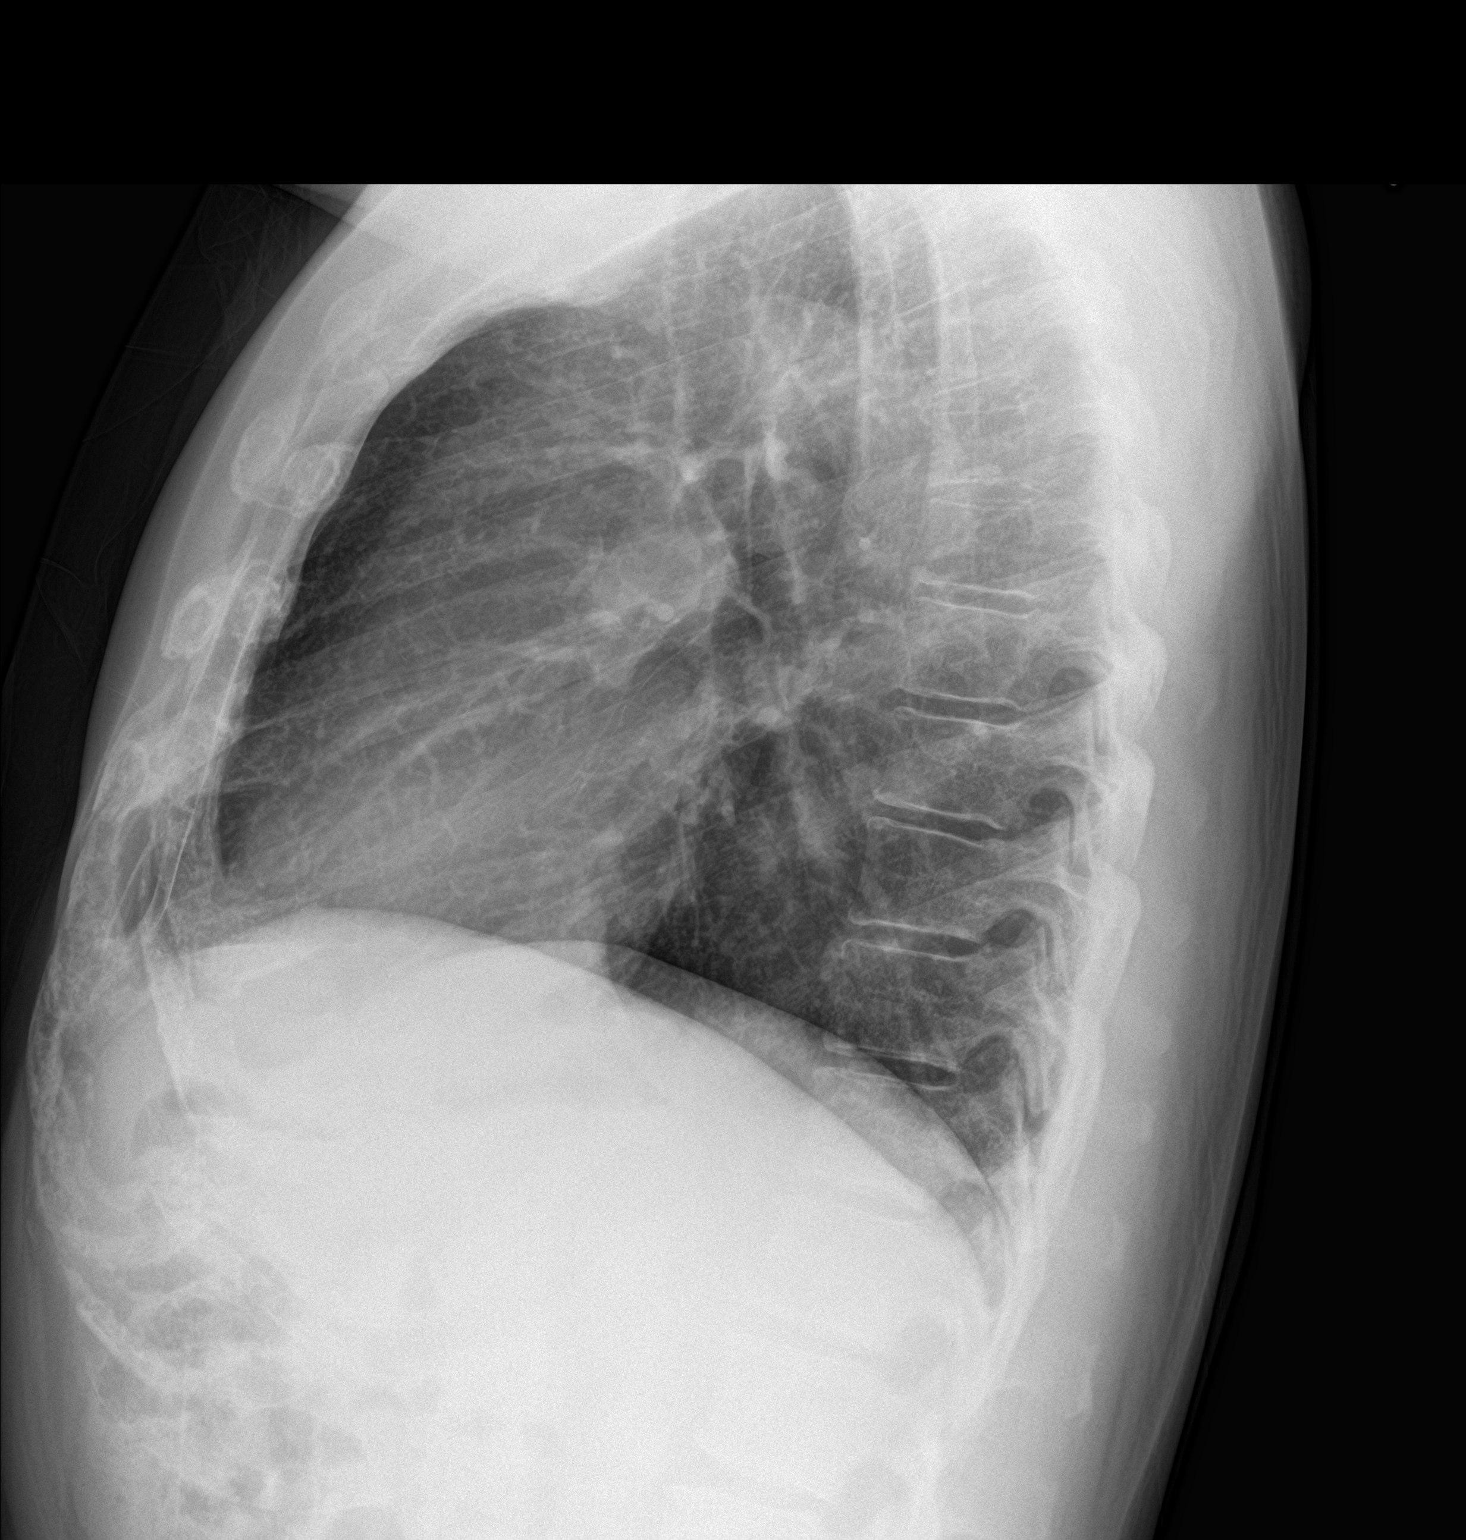

[2 of 2 positions shown; findings below may reference images not displayed]

FINDINGS: The heart size and mediastinal contours are within normal limits.
Both lungs are clear. The visualized skeletal structures are
unremarkable.
IMPRESSION: No active cardiopulmonary disease.

## 2021-12-09 ENCOUNTER — Encounter: Payer: Self-pay | Admitting: Family Medicine

## 2021-12-09 ENCOUNTER — Ambulatory Visit (INDEPENDENT_AMBULATORY_CARE_PROVIDER_SITE_OTHER): Payer: BC Managed Care – PPO

## 2021-12-09 ENCOUNTER — Ambulatory Visit (INDEPENDENT_AMBULATORY_CARE_PROVIDER_SITE_OTHER): Payer: BC Managed Care – PPO | Admitting: Family Medicine

## 2021-12-09 VITALS — BP 110/70 | HR 55 | Temp 98.0°F | Ht 68.0 in | Wt 165.1 lb

## 2021-12-09 DIAGNOSIS — M542 Cervicalgia: Secondary | ICD-10-CM

## 2021-12-09 DIAGNOSIS — M549 Dorsalgia, unspecified: Secondary | ICD-10-CM

## 2021-12-09 NOTE — Progress Notes (Signed)
Established Patient Office Visit  Subjective   Patient ID: Curtis Rodriguez, male    DOB: [birth year]  Age: 56 y.o. MRN: [id number]  Chief Complaint  Patient presents with   Neck Pain    Patient complains of neck pain,    Shoulder Pain    Patient complains of shoulder pain,    Foot Pain    Patient complains of left heel pain, x3 weeks     HPI   Patient seen with some lower cervical neck and predominantly left trapezius area pain for the past really several years although worsening during the past year.  He is currently working from home.  Previously worked for [employer].  Company he works with now involves occasional travel to [place] in [place].  He is very pleased with his current job.  He stays very active physically.  Sometimes has some neck and upper back pain with swimming.  Prolonged periods of sitting at computer.  Tried topical called Arnica with mild relief.  Has had muscle massage without much improvement.  No classic radiculitis symptoms.  Denies upper extremity numbness or weakness.  He is trying to look at ergonomics with his current chair and desk.  Would like to consider possible physical therapy.  Past Medical History:  Diagnosis Date   Anxiety state, unspecified 01/25/2009   BACK PAIN, UPPER 06/04/2010   FATIGUE 11/07/2009   HYPERLIPIDEMIA 01/15/2009   no meds   HYPERTHYROIDISM 11/14/2009   LOW BLOOD PRESSURE 11/07/2009   PREDIABETES 01/25/2009   REACTIVE AIRWAY DISEASE 03/19/2009   Past Surgical History:  Procedure Laterality Date   COLONOSCOPY  2009   Unsure per pt.    reports that he has been smoking cigars. He has a 17.60 pack-year smoking history. He has never used smokeless tobacco. He reports current alcohol use. He reports that he does not currently use drugs. family history includes Diabetes in his mother; Hypertension in his mother. Allergies  Allergen Reactions   Sulfites Rash    Sulfates/sulfites    Review of Systems  Constitutional:  Negative for  chills and fever.  Musculoskeletal:  Positive for neck pain.  Neurological:  Negative for focal weakness.      Objective:     BP 110/70 (BP Location: Left Arm, Patient Position: Sitting, Cuff Size: Normal)   Pulse (!) 55   Temp 98 F (36.7 C) (Oral)   Ht '5\' 8"'$  (1.727 m)   Wt 165 lb 1.6 oz (74.9 kg)   SpO2 98%   BMI 25.10 kg/m    Physical Exam Vitals reviewed.  Constitutional:      Appearance: Normal appearance.  Cardiovascular:     Rate and Rhythm: Normal rate and regular rhythm.  Pulmonary:     Effort: Pulmonary effort is normal.     Breath sounds: Normal breath sounds.  Musculoskeletal:     Cervical back: Neck supple.     Comments: Good range of motion cervical spine with flexion, extension, rotation, and lateral bending.  No spinal tenderness.  Does have some increased muscle tension and tenderness left trapezius muscle.  Lymphadenopathy:     Cervical: No cervical adenopathy.  Neurological:     General: No focal deficit present.     Mental Status: He is alert.     Motor: No weakness.      No results found for any visits on 12/09/21.    The 10-year ASCVD risk score (Arnett DK, et al., 2019) is: 9.8%    Assessment & Plan:  Problem List Items Addressed This Visit   None Visit Diagnoses     Cervical pain (neck)    -  Primary   Relevant Orders   DG Cervical Spine Complete   Ambulatory referral to Physical Therapy     Patient relates some chronic cervical neck pains with radiation toward left trapezius.  Given chronicity of pains obtain cervical spine films.  Discussed conservative measures with heat, massage, topical sports creams.  Consider setting up physical therapy and he is interested.  Needs to set up complete physical.  No follow-ups on file.    Carolann Littler, MD

## 2021-12-09 NOTE — Patient Instructions (Signed)
Consider trial of over the counter topical Voltaren/Diclofenac gel.    Set up complete physical soon.

## 2021-12-23 NOTE — Therapy (Unsigned)
OUTPATIENT PHYSICAL THERAPY CERVICAL EVALUATION   Patient Name: Curtis Rodriguez MRN: [id number] DOB:Aug 23, 1965, 56 y.o., male Today's Date: 12/24/2021   PT End of Session - 12/24/21 0802     Visit Number 1    Number of Visits 12    Date for PT Re-Evaluation 02/18/22    Authorization Type BCBS    PT Start Time 0803    PT Stop Time 0845    PT Time Calculation (min) 42 min             Past Medical History:  Diagnosis Date   Anxiety state, unspecified 01/25/2009   BACK PAIN, UPPER 06/04/2010   FATIGUE 11/07/2009   HYPERLIPIDEMIA 01/15/2009   no meds   HYPERTHYROIDISM 11/14/2009   LOW BLOOD PRESSURE 11/07/2009   PREDIABETES 01/25/2009   REACTIVE AIRWAY DISEASE 03/19/2009   Past Surgical History:  Procedure Laterality Date   COLONOSCOPY  2009   Unsure per pt.   Patient Active Problem List   Diagnosis Date Noted   Neck pain 02/23/2017   Nonallopathic lesion of cervical region 02/23/2017   Nonallopathic lesion of thoracic region 02/23/2017   Nonallopathic lesion of lumbosacral region 02/23/2017   Vitamin D deficiency disease 11/23/2011   Backache 06/04/2010   HYPERTHYROIDISM 11/14/2009   LOW BLOOD PRESSURE 11/07/2009   FATIGUE 11/07/2009   Asthma 03/19/2009   Anxiety state 01/25/2009   PREDIABETES 01/25/2009   Hyperlipidemia 01/15/2009    PCP: [name], MD  REFERRING PROVIDER: [name], MD  REFERRING DIAG: M54.2 (ICD-10-CM) - Cervical pain (neck)  THERAPY DIAG:  Neck pain  Muscle weakness (generalized)  Rationale for Evaluation and Treatment Rehabilitation  ONSET DATE: > 20 year  SUBJECTIVE:                                                                                                                                                                                                         SUBJECTIVE STATEMENT: States that he has pain in his neck since 2003 where he fell down when he was rock climbing in a gym. States that he did not have good  PT at first. States that from 2003-9 he was having a lot of pain and they were doing massage and PT and minimal benefits. States that he moved to [place] and he has been having needling and yoga. Yoga has helped him manage his pain. States that he runs distant. States that he has always had some discomfort and has stopped lifting weights. States that when he wakes up in the morning he has tightness.  Yoga - at [facility] he goes to [facility]- 3x/week - traction hurtwith previous PT  Bench press and OH bothers him PERTINENT HISTORY:  Hx of cervical injury 20 years ago  PAIN:  Are you having pain? Yes: NPRS scale: 6/10 Pain location: left side of neck Pain description: tightness Aggravating factors: lifting weights Relieving factors: yoga, needing, heat, topicals  PRECAUTIONS: None  WEIGHT BEARING RESTRICTIONS No  FALLS:  Has patient fallen in last 6 months? No   OCCUPATION: works from home and sits all day and stretches throughout the day, Luz [employer] a lot. Works for [employer]   PLOF: Independent  PATIENT GOALS to improve work set up, to improve posture and reduce risk of further injury  OBJECTIVE:   DIAGNOSTIC FINDINGS:  12/09/21 cervical xray IMPRESSION: No acute abnormality.  Mild degenerative spondylosis.   COGNITION: Overall cognitive status: Within functional limits for tasks assessed     POSTURE: forward head and flat back  PALPATION: Tenderness to palpation along left UT/levator   CERVICAL ROM:   Active ROM A/PROM (deg) eval  Flexion WFL*  Extension 25% limited  Right lateral flexion WFL  Left lateral flexion WFL  Right rotation [facility]  Left rotation WFL*   (Blank rows = not tested)  *pain/pressure     UE Measurements Upper Extremity Right 12/24/2021 Left 12/24/2021   A/PROM MMT A/PROM MMT  Shoulder Flexion WFL 5 WFL 4+  Shoulder Extension      Shoulder Abduction WFL 5 WFL 4+  Shoulder Adduction      Shoulder Internal Rotation WFL 4+ WFL 4+  Shoulder  External Rotation WFL 4 WFL 4  Elbow Flexion      Elbow Extension      Wrist Flexion      Wrist Extension      Wrist Supination      Wrist Pronation      Wrist Ulnar Deviation      Wrist Radial Deviation      Grip Strength NA  NA     (Blank rows = not tested)   * pain Right handed      FUNCTIONAL TESTS:  Child's pose - limited thoracic flexion and lat length   TODAY'S TREATMENT:  12/24/2021  Therapeutic Exercise:  Aerobic: Supine: scapular protraction with chin tuck - 3 minutes Prone: child's pose with pressing into palms  Seated:  Standing: Neuromuscular Re-education: Manual Therapy: Therapeutic Activity: Self Care: Trigger Point Dry Needling:  Modalities:     PATIENT EDUCATION:  Education details: on current presentation, on HEP, on clinical outcomes score and POC Person educated: Patient Education method: Explanation, Demonstration, and Handouts Education comprehension: verbalized understanding  HOME EXERCISE PROGRAM: [id number]  ASSESSMENT:  CLINICAL IMPRESSION: Patient presents to therapy with complaints of chronic neck pain that has been managed over the years. Overall his pain is better but he would like to continue to work on improving how he moves to reduce risk of injury and continue to be active. Patient would greatly benefit from skilled PT to improve overall function and QOL.   OBJECTIVE IMPAIRMENTS decreased activity tolerance, decreased strength, postural dysfunction, and pain.   ACTIVITY LIMITATIONS reach over head  PARTICIPATION LIMITATIONS: community activity and working out  [place] are also affecting patient's functional outcome.   REHAB POTENTIAL: Good  CLINICAL DECISION MAKING: Stable/uncomplicated  EVALUATION COMPLEXITY: Low   GOALS: Goals reviewed with patient?  yes  SHORT TERM GOALS:  Patient will be independent in self management strategies to improve quality of life  and functional outcomes. Baseline:  new program Target date: 01/21/2022 Goal status: INITIAL  2.  Patient will report at least 50% improvement in overall symptoms and/or function to demonstrate improved functional mobility Baseline: 0% Target date: 01/21/2022 Goal status: INITIAL  3.  Patient will demonstrate good ergonomics with desk set up at home to reduce risk of injury Target date: 01/21/2022 Goal status: INITIAL      LONG TERM GOALS:  Patient will report at least 75% improvement in overall symptoms and/or function to demonstrate improved functional mobility Baseline: 0% Target date: 02/18/2022 Goal status: INITIAL  2.  Patient will be able demonstrate good lifting form with military press and bench press ot reduce stress on cervical spine. Baseline: unable Target date: 02/18/2022 Goal status: INITIAL  3.  Patient will be able to report waking up with reduced stiffness in neck to improve QOL. Baseline: not currently Target date: 02/18/2022 Goal status: INITIAL      PLAN: PT FREQUENCY: 1-3x/week for total of 12 visits over 8 week certification  PT DURATION: 8 weeks  PLANNED INTERVENTIONS: Therapeutic exercises, Therapeutic activity, Neuromuscular re-education, Balance training, Gait training, Patient/Family education, Self Care, Joint mobilization, Joint manipulation, Aquatic Therapy, Dry Needling, Electrical stimulation, Spinal manipulation, Spinal mobilization, Cryotherapy, Moist heat, Taping, Traction, Ultrasound, Ionotophoresis '4mg'$ /ml Dexamethasone, Manual therapy, and Re-evaluation  PLAN FOR NEXT SESSION: assess foot/plantar fascia,TRA activation, adjust yoga poses, lat lengthening, posture improvement/ protraction in yoga pose, ergonomic assessment  11:22 AM, 12/24/21 Jerene Pitch, DPT Physical Therapy with [facility]

## 2021-12-24 ENCOUNTER — Encounter: Payer: Self-pay | Admitting: Physical Therapy

## 2021-12-24 ENCOUNTER — Ambulatory Visit (INDEPENDENT_AMBULATORY_CARE_PROVIDER_SITE_OTHER): Payer: BC Managed Care – PPO | Admitting: Physical Therapy

## 2021-12-24 DIAGNOSIS — M6281 Muscle weakness (generalized): Secondary | ICD-10-CM | POA: Diagnosis not present

## 2021-12-24 DIAGNOSIS — M542 Cervicalgia: Secondary | ICD-10-CM

## 2021-12-31 ENCOUNTER — Encounter: Payer: Self-pay | Admitting: Physical Therapy

## 2021-12-31 ENCOUNTER — Ambulatory Visit (INDEPENDENT_AMBULATORY_CARE_PROVIDER_SITE_OTHER): Payer: BC Managed Care – PPO | Admitting: Physical Therapy

## 2021-12-31 DIAGNOSIS — M6281 Muscle weakness (generalized): Secondary | ICD-10-CM | POA: Diagnosis not present

## 2021-12-31 DIAGNOSIS — M542 Cervicalgia: Secondary | ICD-10-CM | POA: Diagnosis not present

## 2021-12-31 NOTE — Therapy (Addendum)
OUTPATIENT PHYSICAL THERAPY TREATMENT NOTE PHYSICAL THERAPY DISCHARGE SUMMARY  Visits from Start of Care: 2  Current functional level related to goals / functional outcomes: Unable to assess due to unplanned discharge    Remaining deficits: Unable to assess due to unplanned discharge    Education / Equipment: Unable to assess due to unplanned discharge    Patient agrees to discharge. Patient goals were not met. Patient is being discharged due to not returning since the last visit.  3:54 PM, 06/04/22 Jerene Pitch, DPT Physical Therapy with [facility]    Patient Name: Curtis Rodriguez MRN: [id number] DOB:02-08-1966, 56 y.o., male Today's Date: 12/31/2021    END OF SESSION:   PT End of Session - 12/31/21 1020     Visit Number 2    Number of Visits 12    Date for PT Re-Evaluation 02/18/22    Authorization Type BCBS    PT Start Time 1020   late to checkin   PT Stop Time 1059    PT Time Calculation (min) 39 min             Past Medical History:  Diagnosis Date   Anxiety state, unspecified 01/25/2009   BACK PAIN, UPPER 06/04/2010   FATIGUE 11/07/2009   HYPERLIPIDEMIA 01/15/2009   no meds   HYPERTHYROIDISM 11/14/2009   LOW BLOOD PRESSURE 11/07/2009   PREDIABETES 01/25/2009   REACTIVE AIRWAY DISEASE 03/19/2009   Past Surgical History:  Procedure Laterality Date   COLONOSCOPY  2009   Unsure per pt.   Patient Active Problem List   Diagnosis Date Noted   Neck pain 02/23/2017   Nonallopathic lesion of cervical region 02/23/2017   Nonallopathic lesion of thoracic region 02/23/2017   Nonallopathic lesion of lumbosacral region 02/23/2017   Vitamin D deficiency disease 11/23/2011   Backache 06/04/2010   HYPERTHYROIDISM 11/14/2009   LOW BLOOD PRESSURE 11/07/2009   FATIGUE 11/07/2009   Asthma 03/19/2009   Anxiety state 01/25/2009   PREDIABETES 01/25/2009   Hyperlipidemia 01/15/2009     PCP: [name], MD   REFERRING PROVIDER: [name], MD    REFERRING DIAG: M54.2 (ICD-10-CM) - Cervical pain (neck)   THERAPY DIAG:  Neck pain   Muscle weakness (generalized)   Rationale for Evaluation and Treatment Rehabilitation   ONSET DATE: > 20 year   SUBJECTIVE:  SUBJECTIVE STATEMENT: 12/31/2021 States that he has been doing his exercises and he likes how the integrate with his current workout routine. No changes in pain  States that he has pain in his neck since 2003 where he fell down when he was rock climbing in a gym. States that he did not have good PT at first. States that from 2003-9 he was having a lot of pain and they were doing massage and PT and minimal benefits. States that he moved to [place] [employer] and he has been having needling and yoga. Yoga has helped him manage his pain. States that he runs distant. States that he has always had some discomfort and has stopped lifting weights. States that when he wakes up in the morning he has tightness.  Yoga - at [facility] he goes to [facility]- 3x/week - traction hurtwith previous PT  Bench press and OH bothers him PERTINENT HISTORY:  Hx of cervical injury 20 years ago   PAIN:  Are you having pain? no: NPRS scale: 0/10 Pain location: left side of neck Pain description: tightness Aggravating factors: lifting weights Relieving factors: yoga, needing, heat, topicals   PRECAUTIONS: None   WEIGHT BEARING RESTRICTIONS No   FALLS:  Has patient fallen in last 6 months? No     OCCUPATION: works from home and sits all day and stretches throughout the day, [employer] a lot. Works for [employer]    PLOF: Independent   PATIENT GOALS to improve work set up, to improve posture and reduce risk of further injury   OBJECTIVE:    DIAGNOSTIC FINDINGS:  12/09/21 cervical xray IMPRESSION: No acute  abnormality.  Mild degenerative spondylosis.     COGNITION: Overall cognitive status: Within functional limits for tasks assessed         POSTURE: forward head and flat back   PALPATION: Tenderness to palpation along left UT/levator       CERVICAL ROM:    Active ROM A/PROM (deg) eval  Flexion WFL*  Extension 25% limited  Right lateral flexion WFL  Left lateral flexion WFL  Right rotation [facility]  Left rotation WFL*   (Blank rows = not tested)            *pain/pressure                 UE Measurements       Upper Extremity Right 12/24/2021 Left 12/24/2021    A/PROM MMT A/PROM MMT  Shoulder Flexion WFL 5 WFL 4+  Shoulder Extension          Shoulder Abduction WFL 5 WFL 4+  Shoulder Adduction          Shoulder Internal Rotation WFL 4+ WFL 4+  Shoulder External Rotation WFL 4 WFL 4  Elbow Flexion          Elbow Extension          Wrist Flexion          Wrist Extension          Wrist Supination          Wrist Pronation          Wrist Ulnar Deviation          Wrist Radial Deviation          Grip Strength NA   NA                          (Blank rows = not tested)                       *  pain Right handed       12/31/21 SLS 30" but increased ankle sway on right Heel Raise; reduced height B R >L  Right hip exten and abd 4+ and left  5 B arch collasps in SLS    FUNCTIONAL TESTS:  Child's pose - limited thoracic flexion and lat length     TODAY'S TREATMENT:  12/31/2021 Therapeutic Exercise:            Aerobic: Supine:  Prone: child's pose with pressing into palms - reviewed            Seated:            Standing: Neuromuscular Re-education:yoga poses - tree, warrior 1 - focus on foot activation - pressing into blade edge of posterior foot in warrior -lifting all ten toes, weight shifts Manual Therapy: STM to arch B Therapeutic Activity: Self Care: Trigger Point Dry Needling:  Modalities:        PATIENT EDUCATION:  Education details: on current  presentation, on HEP, on possible benefits of compression socks, elevation and ankle pumps with current inversion, on self STM to arch/spot of pain Person educated: Patient Education method: Explanation, Demonstration, and Handouts Education comprehension: verbalized understanding   HOME EXERCISE PROGRAM: [id number]   ASSESSMENT:   CLINICAL IMPRESSION: 12/31/2021 Assessment of foot and ankle on this date. Focused on education of findings and how to incorporate new exercises/awareness to his feet with current yoga routine. Educated patient on soft tissue of foot, compression socks and plan moving forward. Will continue to benefit from skilled PT at this time.   Eval: Patient presents to therapy with complaints of chronic neck pain that has been managed over the years. Overall his pain is better but he would like to continue to work on improving how he moves to reduce risk of injury and continue to be active. Patient would greatly benefit from skilled PT to improve overall function and QOL.     OBJECTIVE IMPAIRMENTS decreased activity tolerance, decreased strength, postural dysfunction, and pain.    ACTIVITY LIMITATIONS reach over head   PARTICIPATION LIMITATIONS: community activity and working out   [place] are also affecting patient's functional outcome.    REHAB POTENTIAL: Good   CLINICAL DECISION MAKING: Stable/uncomplicated   EVALUATION COMPLEXITY: Low     GOALS: Goals reviewed with patient?  yes   SHORT TERM GOALS:   Patient will be independent in self management strategies to improve quality of life and functional outcomes. Baseline: new program Target date: 01/21/2022 Goal status: INITIAL   2.  Patient will report at least 50% improvement in overall symptoms and/or function to demonstrate improved functional mobility Baseline: 0% Target date: 01/21/2022 Goal status: INITIAL   3.  Patient will demonstrate good ergonomics with desk set up at home to  reduce risk of injury Target date: 01/21/2022 Goal status: INITIAL           LONG TERM GOALS:   Patient will report at least 75% improvement in overall symptoms and/or function to demonstrate improved functional mobility Baseline: 0% Target date: 02/18/2022 Goal status: INITIAL   2.  Patient will be able demonstrate good lifting form with military press and bench press ot reduce stress on cervical spine. Baseline: unable Target date: 02/18/2022 Goal status: INITIAL   3.  Patient will be able to report waking up with reduced stiffness in neck to improve QOL. Baseline: not currently Target date: 02/18/2022 Goal status: INITIAL  PLAN: PT FREQUENCY: 1-3x/week for total of 12 visits over 8 week certification   PT DURATION: 8 weeks   PLANNED INTERVENTIONS: Therapeutic exercises, Therapeutic activity, Neuromuscular re-education, Balance training, Gait training, Patient/Family education, Self Care, Joint mobilization, Joint manipulation, Aquatic Therapy, Dry Needling, Electrical stimulation, Spinal manipulation, Spinal mobilization, Cryotherapy, Moist heat, Taping, Traction, Ultrasound, Ionotophoresis '4mg'$ /ml Dexamethasone, Manual therapy, and Re-evaluation   PLAN FOR NEXT SESSION: Focus on activating hip muscles/foot in current yoga practice - for example - twisted poses with hip activation, f/u with foot pain    12:04 PM, 12/31/21 Jerene Pitch, DPT Physical Therapy with [facility]

## 2022-01-07 ENCOUNTER — Encounter: Payer: BC Managed Care – PPO | Admitting: Physical Therapy

## 2022-01-08 NOTE — Therapy (Deleted)
OUTPATIENT PHYSICAL THERAPY TREATMENT NOTE   Patient Name: Curtis Rodriguez MRN: [id number] DOB:06/13/1965, 56 y.o., male Today's Date: 01/08/2022    END OF SESSION:     Past Medical History:  Diagnosis Date   Anxiety state, unspecified 01/25/2009   BACK PAIN, UPPER 06/04/2010   FATIGUE 11/07/2009   HYPERLIPIDEMIA 01/15/2009   no meds   HYPERTHYROIDISM 11/14/2009   LOW BLOOD PRESSURE 11/07/2009   PREDIABETES 01/25/2009   REACTIVE AIRWAY DISEASE 03/19/2009   Past Surgical History:  Procedure Laterality Date   COLONOSCOPY  2009   Unsure per pt.   Patient Active Problem List   Diagnosis Date Noted   Neck pain 02/23/2017   Nonallopathic lesion of cervical region 02/23/2017   Nonallopathic lesion of thoracic region 02/23/2017   Nonallopathic lesion of lumbosacral region 02/23/2017   Vitamin D deficiency disease 11/23/2011   Backache 06/04/2010   HYPERTHYROIDISM 11/14/2009   LOW BLOOD PRESSURE 11/07/2009   FATIGUE 11/07/2009   Asthma 03/19/2009   Anxiety state 01/25/2009   PREDIABETES 01/25/2009   Hyperlipidemia 01/15/2009     PCP: [name], MD   REFERRING PROVIDER: [name], MD   REFERRING DIAG: M54.2 (ICD-10-CM) - Cervical pain (neck)   THERAPY DIAG:  Neck pain   Muscle weakness (generalized)   Rationale for Evaluation and Treatment Rehabilitation   ONSET DATE: > 20 year   SUBJECTIVE:                                                                                                                                                                                                          SUBJECTIVE STATEMENT: 01/08/2022 States that he has been doing his exercises and he likes how the integrate with his current workout routine. No changes in pain  States that he has pain in his neck since 2003 where he fell down when he was rock climbing in a gym. States that he did not have good PT at first. States that from 2003-9 he was having a lot of pain and they  were doing massage and PT and minimal benefits. States that he moved to [place] [employer] and he has been having needling and yoga. Yoga has helped him manage his pain. States that he runs distant. States that he has always had some discomfort and has stopped lifting weights. States that when he wakes up in the morning he has tightness.  Yoga - at [facility] he goes to [facility]- 3x/week - traction hurtwith previous PT  Bench press and OH bothers him PERTINENT HISTORY:  Hx of cervical injury  20 years ago   PAIN:  Are you having pain? no: NPRS scale: 0/10 Pain location: left side of neck Pain description: tightness Aggravating factors: lifting weights Relieving factors: yoga, needing, heat, topicals   PRECAUTIONS: None   WEIGHT BEARING RESTRICTIONS No   FALLS:  Has patient fallen in last 6 months? No     OCCUPATION: works from home and sits all day and stretches throughout the day, [employer] a lot. Works for [employer]    PLOF: Independent   PATIENT GOALS to improve work set up, to improve posture and reduce risk of further injury   OBJECTIVE:    DIAGNOSTIC FINDINGS:  12/09/21 cervical xray IMPRESSION: No acute abnormality.  Mild degenerative spondylosis.     COGNITION: Overall cognitive status: Within functional limits for tasks assessed         POSTURE: forward head and flat back   PALPATION: Tenderness to palpation along left UT/levator       CERVICAL ROM:    Active ROM A/PROM (deg) eval  Flexion WFL*  Extension 25% limited  Right lateral flexion WFL  Left lateral flexion WFL  Right rotation [facility]  Left rotation WFL*   (Blank rows = not tested)            *pain/pressure                 UE Measurements       Upper Extremity Right 12/24/2021 Left 12/24/2021    A/PROM MMT A/PROM MMT  Shoulder Flexion WFL 5 WFL 4+  Shoulder Extension          Shoulder Abduction WFL 5 WFL 4+  Shoulder Adduction          Shoulder Internal Rotation WFL 4+ WFL 4+  Shoulder External Rotation  WFL 4 WFL 4  Elbow Flexion          Elbow Extension          Wrist Flexion          Wrist Extension          Wrist Supination          Wrist Pronation          Wrist Ulnar Deviation          Wrist Radial Deviation          Grip Strength NA   NA                          (Blank rows = not tested)                       * pain Right handed       12/31/21 SLS 30" but increased ankle sway on right Heel Raise; reduced height B R >L  Right hip exten and abd 4+ and left  5 B arch collasps in SLS    FUNCTIONAL TESTS:  Child's pose - limited thoracic flexion and lat length     TODAY'S TREATMENT:  01/08/2022 Therapeutic Exercise:            Aerobic: Supine:  Prone: child's pose with pressing into palms - reviewed            Seated:            Standing: Neuromuscular Re-education:yoga poses - tree, warrior 1 - focus on foot activation - pressing into blade edge of posterior foot in warrior -lifting all ten toes, weight  shifts Manual Therapy: STM to arch B Therapeutic Activity: Self Care: Trigger Point Dry Needling:  Modalities:        PATIENT EDUCATION:  Education details: on current presentation, on HEP, on possible benefits of compression socks, elevation and ankle pumps with current inversion, on self STM to arch/spot of pain Person educated: Patient Education method: Explanation, Demonstration, and Handouts Education comprehension: verbalized understanding   HOME EXERCISE PROGRAM: [id number]   ASSESSMENT:   CLINICAL IMPRESSION: 01/08/2022 Assessment of foot and ankle on this date. Focused on education of findings and how to incorporate new exercises/awareness to his feet with current yoga routine. Educated patient on soft tissue of foot, compression socks and plan moving forward. Will continue to benefit from skilled PT at this time.   Eval: Patient presents to therapy with complaints of chronic neck pain that has been managed over the years. Overall his pain is better but  he would like to continue to work on improving how he moves to reduce risk of injury and continue to be active. Patient would greatly benefit from skilled PT to improve overall function and QOL.     OBJECTIVE IMPAIRMENTS decreased activity tolerance, decreased strength, postural dysfunction, and pain.    ACTIVITY LIMITATIONS reach over head   PARTICIPATION LIMITATIONS: community activity and working out   [place] are also affecting patient's functional outcome.    REHAB POTENTIAL: Good   CLINICAL DECISION MAKING: Stable/uncomplicated   EVALUATION COMPLEXITY: Low     GOALS: Goals reviewed with patient?  yes   SHORT TERM GOALS:   Patient will be independent in self management strategies to improve quality of life and functional outcomes. Baseline: new program Target date: 01/21/2022 Goal status: INITIAL   2.  Patient will report at least 50% improvement in overall symptoms and/or function to demonstrate improved functional mobility Baseline: 0% Target date: 01/21/2022 Goal status: INITIAL   3.  Patient will demonstrate good ergonomics with desk set up at home to reduce risk of injury Target date: 01/21/2022 Goal status: INITIAL           LONG TERM GOALS:   Patient will report at least 75% improvement in overall symptoms and/or function to demonstrate improved functional mobility Baseline: 0% Target date: 02/18/2022 Goal status: INITIAL   2.  Patient will be able demonstrate good lifting form with military press and bench press ot reduce stress on cervical spine. Baseline: unable Target date: 02/18/2022 Goal status: INITIAL   3.  Patient will be able to report waking up with reduced stiffness in neck to improve QOL. Baseline: not currently Target date: 02/18/2022 Goal status: INITIAL           PLAN: PT FREQUENCY: 1-3x/week for total of 12 visits over 8 week certification   PT DURATION: 8 weeks   PLANNED INTERVENTIONS: Therapeutic  exercises, Therapeutic activity, Neuromuscular re-education, Balance training, Gait training, Patient/Family education, Self Care, Joint mobilization, Joint manipulation, Aquatic Therapy, Dry Needling, Electrical stimulation, Spinal manipulation, Spinal mobilization, Cryotherapy, Moist heat, Taping, Traction, Ultrasound, Ionotophoresis '4mg'$ /ml Dexamethasone, Manual therapy, and Re-evaluation   PLAN FOR NEXT SESSION: Focus on activating hip muscles/foot in current yoga practice - for example - twisted poses with hip activation, f/u with foot pain    Rudi Heap PT, DPT 01/08/22  10:45 AM

## 2022-01-13 ENCOUNTER — Encounter: Payer: BC Managed Care – PPO | Admitting: Physical Therapy

## 2022-01-15 ENCOUNTER — Encounter: Payer: BC Managed Care – PPO | Admitting: Physical Therapy

## 2022-01-16 ENCOUNTER — Ambulatory Visit (INDEPENDENT_AMBULATORY_CARE_PROVIDER_SITE_OTHER): Payer: BC Managed Care – PPO | Admitting: Family Medicine

## 2022-01-16 ENCOUNTER — Encounter: Payer: Self-pay | Admitting: Family Medicine

## 2022-01-16 ENCOUNTER — Telehealth: Payer: Self-pay | Admitting: Family Medicine

## 2022-01-16 VITALS — BP 100/70 | HR 55 | Temp 97.6°F | Ht 67.72 in | Wt 163.3 lb

## 2022-01-16 DIAGNOSIS — Z0000 Encounter for general adult medical examination without abnormal findings: Secondary | ICD-10-CM | POA: Diagnosis not present

## 2022-01-16 DIAGNOSIS — Z23 Encounter for immunization: Secondary | ICD-10-CM | POA: Diagnosis not present

## 2022-01-16 DIAGNOSIS — E559 Vitamin D deficiency, unspecified: Secondary | ICD-10-CM

## 2022-01-16 DIAGNOSIS — Z1211 Encounter for screening for malignant neoplasm of colon: Secondary | ICD-10-CM

## 2022-01-16 DIAGNOSIS — Z125 Encounter for screening for malignant neoplasm of prostate: Secondary | ICD-10-CM | POA: Diagnosis not present

## 2022-01-16 LAB — CBC WITH DIFFERENTIAL/PLATELET
Basophils Absolute: 0 10*3/uL (ref 0.0–0.1)
Basophils Relative: 0.7 % (ref 0.0–3.0)
Eosinophils Absolute: 0.2 10*3/uL (ref 0.0–0.7)
Eosinophils Relative: 4.4 % (ref 0.0–5.0)
HCT: 42.4 % (ref 39.0–52.0)
Hemoglobin: 14 g/dL (ref 13.0–17.0)
Lymphocytes Relative: 40.3 % (ref 12.0–46.0)
Lymphs Abs: 1.8 10*3/uL (ref 0.7–4.0)
MCHC: 33.1 g/dL (ref 30.0–36.0)
MCV: 86.5 fl (ref 78.0–100.0)
Monocytes Absolute: 0.4 10*3/uL (ref 0.1–1.0)
Monocytes Relative: 8.6 % (ref 3.0–12.0)
Neutro Abs: 2 10*3/uL (ref 1.4–7.7)
Neutrophils Relative %: 46 % (ref 43.0–77.0)
Platelets: 160 10*3/uL (ref 150.0–400.0)
RBC: 4.9 Mil/uL (ref 4.22–5.81)
RDW: 14.3 % (ref 11.5–15.5)
WBC: 4.4 10*3/uL (ref 4.0–10.5)

## 2022-01-16 LAB — BASIC METABOLIC PANEL
BUN: 15 mg/dL (ref 6–23)
CO2: 30 mEq/L (ref 19–32)
Calcium: 9.3 mg/dL (ref 8.4–10.5)
Chloride: 104 mEq/L (ref 96–112)
Creatinine, Ser: 1.14 mg/dL (ref 0.40–1.50)
GFR: 73.04 mL/min (ref >60.00)
Glucose, Bld: 107 mg/dL — ABNORMAL HIGH (ref 70–99)
Potassium: 4.5 mEq/L (ref 3.5–5.1)
Sodium: 140 mEq/L (ref 135–145)

## 2022-01-16 LAB — PSA: PSA: 0.93 ng/mL (ref 0.10–4.00)

## 2022-01-16 LAB — HEPATIC FUNCTION PANEL
ALT: 21 U/L (ref 0–53)
AST: 21 U/L (ref 0–37)
Albumin: 4.1 g/dL (ref 3.5–5.2)
Alkaline Phosphatase: 41 U/L (ref 39–117)
Bilirubin, Direct: 0.1 mg/dL (ref 0.0–0.3)
Total Bilirubin: 0.4 mg/dL (ref 0.2–1.2)
Total Protein: 6.9 g/dL (ref 6.0–8.3)

## 2022-01-16 LAB — LIPID PANEL
Cholesterol: 245 mg/dL — ABNORMAL HIGH (ref 0–200)
HDL: 46.1 mg/dL (ref >39.00)
LDL Cholesterol: 173 mg/dL — ABNORMAL HIGH (ref 0–99)
NonHDL: 199.23
Total CHOL/HDL Ratio: 5
Triglycerides: 130 mg/dL (ref 0.0–149.0)
VLDL: 26 mg/dL (ref 0.0–40.0)

## 2022-01-16 LAB — TSH: TSH: 3.24 u[IU]/mL (ref 0.35–5.50)

## 2022-01-16 MED ORDER — ALBUTEROL SULFATE HFA 108 (90 BASE) MCG/ACT IN AERS: INHALATION_SPRAY | RESPIRATORY_TRACT | 1 refills | Status: DC

## 2022-01-16 NOTE — Progress Notes (Addendum)
Established Patient Office Visit  Subjective   Patient ID: Rivan Siordia, male    DOB: [birth year]  Age: 56 y.o. MRN: [id number]  Chief Complaint  Patient presents with   Annual Exam    HPI   Here for complete physical.  He is currently working with a different company and works from home frequently.  He does require frequent travel to [place] and [place].  Still exercising fairly regularly.  He has done an excellent job keeping his weight down over the past few years.  Generally very healthy.  Takes no regular prescription medications currently.  Health maintenance reviewed  -No history of shingles vaccine -No history of hepatitis C screening.  Overall low risk. -Last colonoscopy over 10 years ago.  Father was diagnosed with colon cancer around age 43 -No flu vaccine yet but he would like to get this -Tetanus due 2028  Family history-mother with history of type 2 diabetes and hypertension.  Father with history of colon cancer.  He has a brother who is generally healthy.  Social history-he is married.  Non-smoker.  No regular alcohol.  Works for [employer]  The 10-year ASCVD risk score (Arnett DK, et al., 2019) is: 8.8%   Values used to calculate the score:     Age: 87 years     Sex: Male     Is Non-Hispanic African American: No     Diabetic: No     Tobacco smoker: Yes     Systolic Blood Pressure: 528 mmHg     Is BP treated: No     HDL Cholesterol: 46.1 mg/dL     Total Cholesterol: 245 mg/dL   Past Medical History:  Diagnosis Date   Anxiety state, unspecified 01/25/2009   BACK PAIN, UPPER 06/04/2010   FATIGUE 11/07/2009   HYPERLIPIDEMIA 01/15/2009   no meds   HYPERTHYROIDISM 11/14/2009   LOW BLOOD PRESSURE 11/07/2009   PREDIABETES 01/25/2009   REACTIVE AIRWAY DISEASE 03/19/2009   Past Surgical History:  Procedure Laterality Date   COLONOSCOPY  2009   Unsure per pt.    reports that he has been smoking cigars. He has a 17.60 pack-year smoking  history. He has never used smokeless tobacco. He reports current alcohol use. He reports that he does not currently use drugs. family history includes Diabetes in his mother; Hypertension in his mother. Allergies  Allergen Reactions   Sulfites Rash    Sulfates/sulfites     Review of Systems  Constitutional:  Negative for chills, fever, malaise/fatigue and weight loss.  HENT:  Negative for hearing loss.   Eyes:  Negative for blurred vision and double vision.  Respiratory:  Negative for cough and shortness of breath.   Cardiovascular:  Negative for chest pain, palpitations and leg swelling.  Gastrointestinal:  Negative for abdominal pain, blood in stool, constipation and diarrhea.  Genitourinary:  Negative for dysuria.  Skin:  Negative for rash.  Neurological:  Negative for dizziness, speech change, seizures, loss of consciousness and headaches.  Psychiatric/Behavioral:  Negative for depression.       Objective:     BP 100/70 (BP Location: Left Arm, Patient Position: Sitting, Cuff Size: Normal)   Pulse (!) 55   Temp 97.6 F (36.4 C) (Oral)   Ht 5' 7.72" (1.72 m)   Wt 163 lb 4.8 oz (74.1 kg)   SpO2 98%   BMI 25.04 kg/m    Physical Exam Vitals reviewed.  Constitutional:      General: He is  not in acute distress.    Appearance: He is well-developed.  HENT:     Head: Normocephalic and atraumatic.     Right Ear: External ear normal.     Left Ear: External ear normal.  Eyes:     Conjunctiva/sclera: Conjunctivae normal.     Pupils: Pupils are equal, round, and reactive to light.  Neck:     Thyroid: No thyromegaly.  Cardiovascular:     Rate and Rhythm: Normal rate and regular rhythm.     Heart sounds: Normal heart sounds. No murmur heard. Pulmonary:     Effort: No respiratory distress.     Breath sounds: No wheezing or rales.  Abdominal:     General: Bowel sounds are normal. There is no distension.     Palpations: Abdomen is soft. There is no mass.     Tenderness:  There is no abdominal tenderness. There is no guarding or rebound.  Musculoskeletal:     Cervical back: Normal range of motion and neck supple.  Lymphadenopathy:     Cervical: No cervical adenopathy.  Skin:    Findings: No rash.  Neurological:     Mental Status: He is alert and oriented to person, place, and time.     Cranial Nerves: No cranial nerve deficit.      No results found for any visits on 01/16/22.    The 10-year ASCVD risk score (Arnett DK, et al., 2019) is: 8.4%    Assessment & Plan:   Problem List Items Addressed This Visit   None Visit Diagnoses     Colon cancer screening    -  Primary   Relevant Orders   Ambulatory referral to Gastroenterology   Physical exam       Relevant Orders   Basic metabolic panel   Lipid panel   CBC with Differential/Platelet   Hepatic function panel   TSH   PSA   Hep C Antibody     -Flu vaccine given -Obtain screening labs as above including hepatitis C antibody -Set up repeat colonoscopy -Continue regular exercise habits -Discussed Shingrix vaccine and he will check on insurance coverage  No follow-ups on file.    Carolann Littler, MD

## 2022-01-16 NOTE — Telephone Encounter (Signed)
Rx sent 

## 2022-01-16 NOTE — Addendum Note (Signed)
Addended by: Nilda Riggs on: 01/16/2022 08:18 AM   Modules accepted: Orders

## 2022-01-16 NOTE — Addendum Note (Signed)
Addended by: Nilda Riggs on: 01/16/2022 04:23 PM   Modules accepted: Orders

## 2022-01-16 NOTE — Telephone Encounter (Signed)
Requesting refill of albuterol (VENTOLIN HFA) 108 (90 Base) MCG/ACT inhaler. Also requesting to have his vitamin D checked.

## 2022-01-19 LAB — HEPATITIS C ANTIBODY: Hepatitis C Ab: NONREACTIVE

## 2022-01-19 NOTE — Therapy (Deleted)
OUTPATIENT PHYSICAL THERAPY TREATMENT NOTE   Patient Name: Curtis Rodriguez MRN: [id number] DOB:15-Oct-1965, 56 y.o., male Today's Date: 01/19/2022    END OF SESSION:     Past Medical History:  Diagnosis Date   Anxiety state, unspecified 01/25/2009   BACK PAIN, UPPER 06/04/2010   FATIGUE 11/07/2009   HYPERLIPIDEMIA 01/15/2009   no meds   HYPERTHYROIDISM 11/14/2009   LOW BLOOD PRESSURE 11/07/2009   PREDIABETES 01/25/2009   REACTIVE AIRWAY DISEASE 03/19/2009   Past Surgical History:  Procedure Laterality Date   COLONOSCOPY  2009   Unsure per pt.   Patient Active Problem List   Diagnosis Date Noted   Neck pain 02/23/2017   Nonallopathic lesion of cervical region 02/23/2017   Nonallopathic lesion of thoracic region 02/23/2017   Nonallopathic lesion of lumbosacral region 02/23/2017   Vitamin D deficiency disease 11/23/2011   Backache 06/04/2010   HYPERTHYROIDISM 11/14/2009   LOW BLOOD PRESSURE 11/07/2009   FATIGUE 11/07/2009   Asthma 03/19/2009   Anxiety state 01/25/2009   PREDIABETES 01/25/2009   Hyperlipidemia 01/15/2009     PCP: [name], MD   REFERRING PROVIDER: [name], MD   REFERRING DIAG: M54.2 (ICD-10-CM) - Cervical pain (neck)   THERAPY DIAG:  Neck pain   Muscle weakness (generalized)   Rationale for Evaluation and Treatment Rehabilitation   ONSET DATE: > 20 year   SUBJECTIVE:                                                                                                                                                                                                          SUBJECTIVE STATEMENT: 01/19/2022 States that he has been doing his exercises and he likes how the integrate with his current workout routine. No changes in pain  States that he has pain in his neck since 2003 where he fell down when he was rock climbing in a gym. States that he did not have good PT at first. States that from 2003-9 he was having a lot of pain and they  were doing massage and PT and minimal benefits. States that he moved to [place] [employer] and he has been having needling and yoga. Yoga has helped him manage his pain. States that he runs distant. States that he has always had some discomfort and has stopped lifting weights. States that when he wakes up in the morning he has tightness.  Yoga - at [facility] he goes to Y[facility]- 3x/week - traction hurtwith previous PT  Bench press and [place] bothers him PERTINENT HISTORY:  Hx of cervical injury  20 years ago   PAIN:  Are you having pain? no: NPRS scale: 0/10 Pain location: left side of neck Pain description: tightness Aggravating factors: lifting weights Relieving factors: yoga, needing, heat, topicals   PRECAUTIONS: None   WEIGHT BEARING RESTRICTIONS No   FALLS:  Has patient fallen in last 6 months? No     OCCUPATION: works from home and sits all day and stretches throughout the day, [employer] a lot. Works for [employer]    PLOF: Independent   PATIENT GOALS to improve work set up, to improve posture and reduce risk of further injury   OBJECTIVE:    DIAGNOSTIC FINDINGS:  12/09/21 cervical xray IMPRESSION: No acute abnormality.  Mild degenerative spondylosis.     COGNITION: Overall cognitive status: Within functional limits for tasks assessed         POSTURE: forward head and flat back   PALPATION: Tenderness to palpation along left UT/levator       CERVICAL ROM:    Active ROM A/PROM (deg) eval  Flexion WFL*  Extension 25% limited  Right lateral flexion WFL  Left lateral flexion WFL  Right rotation [facility]  Left rotation WFL*   (Blank rows = not tested)            *pain/pressure                 UE Measurements       Upper Extremity Right 12/24/2021 Left 12/24/2021    A/PROM MMT A/PROM MMT  Shoulder Flexion WFL 5 WFL 4+  Shoulder Extension          Shoulder Abduction WFL 5 WFL 4+  Shoulder Adduction          Shoulder Internal Rotation WFL 4+ WFL 4+  Shoulder External Rotation  WFL 4 WFL 4  Elbow Flexion          Elbow Extension          Wrist Flexion          Wrist Extension          Wrist Supination          Wrist Pronation          Wrist Ulnar Deviation          Wrist Radial Deviation          Grip Strength NA   NA                          (Blank rows = not tested)                       * pain Right handed       12/31/21 SLS 30" but increased ankle sway on right Heel Raise; reduced height B R >L  Right hip exten and abd 4+ and left  5 B arch collasps in SLS    FUNCTIONAL TESTS:  Child's pose - limited thoracic flexion and lat length     TODAY'S TREATMENT:  01/19/2022 Therapeutic Exercise:            Aerobic: Supine:  Prone: child's pose with pressing into palms - reviewed            Seated:            Standing: Neuromuscular Re-education:yoga poses - tree, warrior 1 - focus on foot activation - pressing into blade edge of posterior foot in warrior -lifting all ten toes, weight  shifts Manual Therapy: STM to arch B Therapeutic Activity: Self Care: Trigger Point Dry Needling:  Modalities:        PATIENT EDUCATION:  Education details: on current presentation, on HEP, on possible benefits of compression socks, elevation and ankle pumps with current inversion, on self STM to arch/spot of pain Person educated: Patient Education method: Explanation, Demonstration, and Handouts Education comprehension: verbalized understanding   HOME EXERCISE PROGRAM: [id number]   ASSESSMENT:   CLINICAL IMPRESSION: 01/19/2022 Assessment of foot and ankle on this date. Focused on education of findings and how to incorporate new exercises/awareness to his feet with current yoga routine. Educated patient on soft tissue of foot, compression socks and plan moving forward. Will continue to benefit from skilled PT at this time.   Eval: Patient presents to therapy with complaints of chronic neck pain that has been managed over the years. Overall his pain is better but  he would like to continue to work on improving how he moves to reduce risk of injury and continue to be active. Patient would greatly benefit from skilled PT to improve overall function and QOL.     OBJECTIVE IMPAIRMENTS decreased activity tolerance, decreased strength, postural dysfunction, and pain.    ACTIVITY LIMITATIONS reach over head   PARTICIPATION LIMITATIONS: community activity and working out   [place] are also affecting patient's functional outcome.    REHAB POTENTIAL: Good   CLINICAL DECISION MAKING: Stable/uncomplicated   EVALUATION COMPLEXITY: Low     GOALS: Goals reviewed with patient?  yes   SHORT TERM GOALS:   Patient will be independent in self management strategies to improve quality of life and functional outcomes. Baseline: new program Target date: 01/21/2022 Goal status: INITIAL   2.  Patient will report at least 50% improvement in overall symptoms and/or function to demonstrate improved functional mobility Baseline: 0% Target date: 01/21/2022 Goal status: INITIAL   3.  Patient will demonstrate good ergonomics with desk set up at home to reduce risk of injury Target date: 01/21/2022 Goal status: INITIAL           LONG TERM GOALS:   Patient will report at least 75% improvement in overall symptoms and/or function to demonstrate improved functional mobility Baseline: 0% Target date: 02/18/2022 Goal status: INITIAL   2.  Patient will be able demonstrate good lifting form with military press and bench press ot reduce stress on cervical spine. Baseline: unable Target date: 02/18/2022 Goal status: INITIAL   3.  Patient will be able to report waking up with reduced stiffness in neck to improve QOL. Baseline: not currently Target date: 02/18/2022 Goal status: INITIAL           PLAN: PT FREQUENCY: 1-3x/week for total of 12 visits over 8 week certification   PT DURATION: 8 weeks   PLANNED INTERVENTIONS: Therapeutic  exercises, Therapeutic activity, Neuromuscular re-education, Balance training, Gait training, Patient/Family education, Self Care, Joint mobilization, Joint manipulation, Aquatic Therapy, Dry Needling, Electrical stimulation, Spinal manipulation, Spinal mobilization, Cryotherapy, Moist heat, Taping, Traction, Ultrasound, Ionotophoresis '4mg'$ /ml Dexamethasone, Manual therapy, and Re-evaluation   PLAN FOR NEXT SESSION: Focus on activating hip muscles/foot in current yoga practice - for example - twisted poses with hip activation, f/u with foot pain    Rudi Heap PT, DPT 01/19/22  10:17 AM

## 2022-01-22 ENCOUNTER — Encounter: Payer: BC Managed Care – PPO | Admitting: Physical Therapy

## 2022-01-22 ENCOUNTER — Telehealth: Payer: Self-pay | Admitting: Family Medicine

## 2022-01-22 DIAGNOSIS — F411 Generalized anxiety disorder: Secondary | ICD-10-CM

## 2022-01-22 DIAGNOSIS — E785 Hyperlipidemia, unspecified: Secondary | ICD-10-CM

## 2022-01-22 NOTE — Telephone Encounter (Signed)
Pt has lab appt tomorrow and would like to add a1c  to order. Pt is aware md not in office today

## 2022-01-22 NOTE — Telephone Encounter (Addendum)
Pt is calling and would like a refill on dymista  [id number] [place]  [phone] Fax:  [phone]

## 2022-01-23 ENCOUNTER — Other Ambulatory Visit: Payer: BC Managed Care – PPO

## 2022-01-23 DIAGNOSIS — E559 Vitamin D deficiency, unspecified: Secondary | ICD-10-CM

## 2022-01-23 DIAGNOSIS — E785 Hyperlipidemia, unspecified: Secondary | ICD-10-CM

## 2022-01-23 LAB — HEMOGLOBIN A1C: Hgb A1c MFr Bld: 6.3 % (ref 4.6–6.5)

## 2022-01-23 LAB — VITAMIN D 25 HYDROXY (VIT D DEFICIENCY, FRACTURES): VITD: 29.13 ng/mL — ABNORMAL LOW (ref 30.00–100.00)

## 2022-01-23 MED ORDER — FLUTICASONE PROPIONATE 50 MCG/ACT NA SUSP: 2.0000 | Freq: Every day | NASAL | 3 refills | Status: DC

## 2022-01-23 NOTE — Telephone Encounter (Signed)
Rx sent 

## 2022-01-23 NOTE — Addendum Note (Signed)
Addended by: Nilda Riggs on: 01/23/2022 03:12 PM   Modules accepted: Orders

## 2022-01-23 NOTE — Telephone Encounter (Signed)
Labs added.

## 2022-01-26 ENCOUNTER — Encounter: Payer: BC Managed Care – PPO | Admitting: Physical Therapy

## 2022-04-21 ENCOUNTER — Other Ambulatory Visit: Payer: Self-pay | Admitting: Family Medicine

## 2022-05-12 ENCOUNTER — Encounter: Payer: Self-pay | Admitting: Family Medicine

## 2022-05-12 ENCOUNTER — Ambulatory Visit (INDEPENDENT_AMBULATORY_CARE_PROVIDER_SITE_OTHER): Payer: BC Managed Care – PPO | Admitting: Family Medicine

## 2022-05-12 VITALS — BP 116/80 | HR 50 | Temp 97.8°F | Ht 67.7 in | Wt 171.2 lb

## 2022-05-12 DIAGNOSIS — M7711 Lateral epicondylitis, right elbow: Secondary | ICD-10-CM

## 2022-05-12 NOTE — Patient Instructions (Signed)
Try icing the right lateral elbow 15-20 minutes 3 times daily  Consider topical application of Voltaren/Diclofenac get three times daily.

## 2022-05-12 NOTE — Progress Notes (Signed)
   Established Patient Office Visit  Subjective   Patient ID: Curtis Rodriguez, male    DOB: 04-04-1966  Age: 57 y.o. MRN: [id number]  Chief Complaint  Patient presents with   Hand Pain    Patient complains of right hand pain, x2 weeks   Elbow Pain    Patient complains of elbow pain, x2 weeks, Patient reports he may have injured elbow on door    HPI   Seen with right lateral elbow pain for the past 3 weeks or so.  Denies any injury.  Pain is worse with gripping especially when engaging extensor muscles.  He is right-hand dominant.  Has not tried any ice or topicals such as diclofenac.  He has been using compression band with minimal improvement.  Denies any right cervical radiculitis symptoms.  He does do some weightlifting but has not noted any problems with things like bench press  Past Medical History:  Diagnosis Date   Anxiety state, unspecified 01/25/2009   BACK PAIN, UPPER 06/04/2010   FATIGUE 11/07/2009   HYPERLIPIDEMIA 01/15/2009   no meds   HYPERTHYROIDISM 11/14/2009   LOW BLOOD PRESSURE 11/07/2009   PREDIABETES 01/25/2009   REACTIVE AIRWAY DISEASE 03/19/2009   Past Surgical History:  Procedure Laterality Date   COLONOSCOPY  2009   Unsure per pt.    reports that he has been smoking cigars. He has a 17.60 pack-year smoking history. He has never used smokeless tobacco. He reports current alcohol use. He reports that he does not currently use drugs. family history includes Diabetes in his mother; Hypertension in his mother. Allergies  Allergen Reactions   Sulfites Rash    Sulfates/sulfites    Review of Systems  Neurological:  Negative for tingling and weakness.      Objective:     BP 116/80 (BP Location: Left Arm, Patient Position: Sitting, Cuff Size: Normal)   Pulse (!) 50   Temp 97.8 F (36.6 C) (Oral)   Ht 5' 7.7" (1.72 m)   Wt 171 lb 3.2 oz (77.7 kg)   SpO2 99%   BMI 26.26 kg/m    Physical Exam Vitals reviewed.  Constitutional:      Appearance: Normal  appearance.  Cardiovascular:     Rate and Rhythm: Normal rate and regular rhythm.  Pulmonary:     Effort: Pulmonary effort is normal.     Breath sounds: Normal breath sounds.  Musculoskeletal:     Comments: Right elbow reveals no visible edema.  No erythema.  Full range of motion.  He has some localized tenderness over the lateral epicondylar region.  Mild pain with wrist extension against resistance.  Neurological:     Mental Status: He is alert.     Comments: Full strength right upper extremity      No results found for any visits on 05/12/22.    The 10-year ASCVD risk score (Arnett DK, et al., 2019) is: 11.3%    Assessment & Plan:   Problem List Items Addressed This Visit   None Visit Diagnoses     Right lateral epicondylitis    -  Primary     -We recommend trial of icing 15 to 20 minutes 3 times daily -Consider over-the-counter topical diclofenac gel 3-4 times daily -Avoid repetitive gripping is much as possible -Handout on lateral epicondylitis given -Touch base for any persistent or worsening symptoms  No follow-ups on file.    Carolann Littler, MD

## 2022-06-30 ENCOUNTER — Other Ambulatory Visit: Payer: Self-pay | Admitting: Family Medicine

## 2022-10-08 ENCOUNTER — Other Ambulatory Visit: Payer: Self-pay | Admitting: Family

## 2022-10-08 ENCOUNTER — Telehealth: Payer: Self-pay | Admitting: Family Medicine

## 2022-10-08 MED ORDER — ESCITALOPRAM OXALATE 5 MG PO TABS: 5.0000 mg | ORAL_TABLET | Freq: Every day | ORAL | 0 refills | Status: DC

## 2022-10-08 NOTE — Telephone Encounter (Addendum)
Pt called to ask if it was possible for him to resume taking the Lexapro 10 mg, MD prescribed a few years ago?  Pt was informed that MD is OOO until October 12, 2022.  Pt has been scheduled for a OV on 10/23/22, but was put on the wait list in case something became available sooner.  Please advise.

## 2022-10-09 NOTE — Telephone Encounter (Signed)
Patient informed of the message below and voiced understanding  

## 2022-10-23 ENCOUNTER — Ambulatory Visit: Payer: BC Managed Care – PPO | Admitting: Family Medicine

## 2022-10-30 ENCOUNTER — Ambulatory Visit (INDEPENDENT_AMBULATORY_CARE_PROVIDER_SITE_OTHER): Payer: BC Managed Care – PPO | Admitting: Family Medicine

## 2022-10-30 ENCOUNTER — Encounter: Payer: Self-pay | Admitting: Family Medicine

## 2022-10-30 VITALS — BP 110/82 | HR 53 | Temp 97.6°F | Ht 67.7 in | Wt 162.7 lb

## 2022-10-30 DIAGNOSIS — L821 Other seborrheic keratosis: Secondary | ICD-10-CM

## 2022-10-30 DIAGNOSIS — Z1211 Encounter for screening for malignant neoplasm of colon: Secondary | ICD-10-CM

## 2022-10-30 DIAGNOSIS — M549 Dorsalgia, unspecified: Secondary | ICD-10-CM | POA: Diagnosis not present

## 2022-10-30 DIAGNOSIS — E785 Hyperlipidemia, unspecified: Secondary | ICD-10-CM

## 2022-10-30 DIAGNOSIS — R7309 Other abnormal glucose: Secondary | ICD-10-CM

## 2022-10-30 NOTE — Progress Notes (Unsigned)
Established Patient Office Visit  Subjective   Patient ID: Curtis Rodriguez, male    DOB: 09-28-1965  Age: 57 y.o. MRN: [id number]  Chief Complaint  Patient presents with   Medication Consultation    HPI  {History (Optional):23778} Curtis Rodriguez is here to discuss several issues today:  He has history of recurrent neck and upper back pain.  He thinks he carries a lot of tension there.  Has benefited greatly from running along with yoga and meditation in the past and also has used very low-dose Lexapro 10 mg just a quarter of a tablet per day which seems to help tremendously.  He got a refill recently in my absence.  Does not need refill at this time.  Occasional cervical neck pain.  Last year had x-rays which showed only mild degenerative changes.  No recent radiculitis symptoms.  He has history of dermatosis papulosis nigra of the face.  He has seen dermatology in the past and had laser therapy at 1 point which helped but has had some regrowth.  Would like referral back to dermatologist.  He has history of prediabetes range blood sugars.  Last A1c 6.3%.  Has gained a bit of weight recently.  Still exercising but not quite as diligently.  Not running quite as much.  He would like to get his weight back below 160 pounds.  Requesting Cologuard.  He did have colonoscopy age 80 with no polyps.  His father did apparently have diagnosis of colon cancer.  Patient is reluctant to do further colonoscopy.  No recent stool changes.  Past Medical History:  Diagnosis Date   Anxiety state, unspecified 01/25/2009   BACK PAIN, UPPER 06/04/2010   FATIGUE 11/07/2009   HYPERLIPIDEMIA 01/15/2009   no meds   HYPERTHYROIDISM 11/14/2009   LOW BLOOD PRESSURE 11/07/2009   PREDIABETES 01/25/2009   REACTIVE AIRWAY DISEASE 03/19/2009   Past Surgical History:  Procedure Laterality Date   COLONOSCOPY  2009   Unsure per pt.    reports that he has been smoking cigars. He has a 17.60 pack-year smoking history. He has never  used smokeless tobacco. He reports current alcohol use. He reports that he does not currently use drugs. family history includes Diabetes in his mother; Hypertension in his mother. Allergies  Allergen Reactions   Sulfites Rash    Sulfates/sulfites    Review of Systems  Constitutional:  Negative for malaise/fatigue.  Eyes:  Negative for blurred vision.  Respiratory:  Negative for shortness of breath.   Cardiovascular:  Negative for chest pain.  Gastrointestinal:  Negative for blood in stool and melena.  Neurological:  Negative for dizziness, weakness and headaches.      Objective:     BP 110/82 (BP Location: Left Arm, Patient Position: Sitting, Cuff Size: Normal)   Pulse (!) 53   Temp 97.6 F (36.4 C) (Oral)   Ht 5' 7.7" (1.72 m)   Wt 162 lb 11.2 oz (73.8 kg)   SpO2 100%   BMI 24.96 kg/m  {Vitals History (Optional):23777}  Physical Exam Vitals reviewed.  Constitutional:      Appearance: He is well-developed.  HENT:     Right Ear: External ear normal.     Left Ear: External ear normal.  Eyes:     Pupils: Pupils are equal, round, and reactive to light.  Neck:     Thyroid: No thyromegaly.  Cardiovascular:     Rate and Rhythm: Normal rate and regular rhythm.  Pulmonary:     Effort: Pulmonary  effort is normal. No respiratory distress.     Breath sounds: Normal breath sounds. No wheezing or rales.  Musculoskeletal:     Cervical back: Neck supple.  Neurological:     Mental Status: He is alert and oriented to person, place, and time.      No results found for any visits on 10/30/22.  {Labs (Optional):23779}  The 10-year ASCVD risk score (Arnett DK, et al., 2019) is: 10.8%    Assessment & Plan:   #1 dermatosis papulosis nigra.  He is aware this is cosmetic but he would like to have referral back to dermatology to consider treatment.  Has benefited from laser therapy in the past.  Referral placed  #2 history of chronic intermittent upper back and neck pain.   Interestingly, he has noted when he takes low-dose Lexapro in the past and his symptoms are greatly improved.  This was recently refilled.  Continue active stretching and follow-up for any cervical radiculitis symptoms or other new changes  #3 colon cancer screening.  Patient requesting Cologuard.  He did have colonoscopy age 21 with no polyps.  We have strongly recommend he consider ongoing colonoscopy every 5 years with family history of that he prefers Cologuard.  He is aware of limitations of Cologuard.  We reviewed sensitivity and specificity of this test  #4 history of prediabetes range blood sugars.  Last A1c 6.3%.  Work on maintaining good weight control and regular exercise.  He has upcoming physical in September.  Check labs then including A1c  #5 history of hyper cholesterolemia.  10-year calculated risk for CAD 10.3%.  Handout given on coronary calcium score.  Consider getting coronary calcium scan at physical for further restratification   No follow-ups on file.    Evelena Peat, MD

## 2022-11-30 DIAGNOSIS — Z1211 Encounter for screening for malignant neoplasm of colon: Secondary | ICD-10-CM | POA: Diagnosis not present

## 2022-12-24 ENCOUNTER — Encounter (INDEPENDENT_AMBULATORY_CARE_PROVIDER_SITE_OTHER): Payer: Self-pay

## 2023-01-18 ENCOUNTER — Telehealth: Payer: Self-pay | Admitting: Family Medicine

## 2023-01-18 NOTE — Telephone Encounter (Signed)
I spoke with the patient and he inquired about having Coronary calcium scan and I informed him that this can be discussed during visit tomorrow with PCP.

## 2023-01-18 NOTE — Telephone Encounter (Signed)
Pt has some questions/concerns regarding tomorrow's blood work following CPE.  Pt would like a call back to discuss.

## 2023-01-19 ENCOUNTER — Ambulatory Visit (INDEPENDENT_AMBULATORY_CARE_PROVIDER_SITE_OTHER): Payer: BC Managed Care – PPO | Admitting: Family Medicine

## 2023-01-19 ENCOUNTER — Encounter: Payer: Self-pay | Admitting: Family Medicine

## 2023-01-19 VITALS — BP 124/80 | HR 60 | Temp 97.5°F | Ht 67.72 in | Wt 166.3 lb

## 2023-01-19 DIAGNOSIS — E559 Vitamin D deficiency, unspecified: Secondary | ICD-10-CM

## 2023-01-19 DIAGNOSIS — E785 Hyperlipidemia, unspecified: Secondary | ICD-10-CM

## 2023-01-19 DIAGNOSIS — Z0000 Encounter for general adult medical examination without abnormal findings: Secondary | ICD-10-CM

## 2023-01-19 DIAGNOSIS — R7303 Prediabetes: Secondary | ICD-10-CM | POA: Diagnosis not present

## 2023-01-19 LAB — CBC WITH DIFFERENTIAL/PLATELET
Basophils Absolute: 0 10*3/uL (ref 0.0–0.1)
Basophils Relative: 0.5 % (ref 0.0–3.0)
Eosinophils Absolute: 0.2 10*3/uL (ref 0.0–0.7)
Eosinophils Relative: 5.1 % — ABNORMAL HIGH (ref 0.0–5.0)
HCT: 41.6 % (ref 39.0–52.0)
Hemoglobin: 13.5 g/dL (ref 13.0–17.0)
Lymphocytes Relative: 41.1 % (ref 12.0–46.0)
Lymphs Abs: 1.5 10*3/uL (ref 0.7–4.0)
MCHC: 32.4 g/dL (ref 30.0–36.0)
MCV: 88.1 fl (ref 78.0–100.0)
Monocytes Absolute: 0.3 10*3/uL (ref 0.1–1.0)
Monocytes Relative: 8.9 % (ref 3.0–12.0)
Neutro Abs: 1.7 10*3/uL (ref 1.4–7.7)
Neutrophils Relative %: 44.4 % (ref 43.0–77.0)
Platelets: 186 10*3/uL (ref 150.0–400.0)
RBC: 4.72 Mil/uL (ref 4.22–5.81)
RDW: 14.4 % (ref 11.5–15.5)
WBC: 3.8 10*3/uL — ABNORMAL LOW (ref 4.0–10.5)

## 2023-01-19 LAB — HEPATIC FUNCTION PANEL
ALT: 16 U/L (ref 0–53)
AST: 18 U/L (ref 0–37)
Albumin: 4.1 g/dL (ref 3.5–5.2)
Alkaline Phosphatase: 40 U/L (ref 39–117)
Bilirubin, Direct: 0.1 mg/dL (ref 0.0–0.3)
Total Bilirubin: 0.4 mg/dL (ref 0.2–1.2)
Total Protein: 6.9 g/dL (ref 6.0–8.3)

## 2023-01-19 LAB — BASIC METABOLIC PANEL
BUN: 14 mg/dL (ref 6–23)
CO2: 28 meq/L (ref 19–32)
Calcium: 8.9 mg/dL (ref 8.4–10.5)
Chloride: 105 meq/L (ref 96–112)
Creatinine, Ser: 1.06 mg/dL (ref 0.40–1.50)
GFR: 79.14 mL/min (ref >60.00)
Glucose, Bld: 99 mg/dL (ref 70–99)
Potassium: 4.1 meq/L (ref 3.5–5.1)
Sodium: 139 meq/L (ref 135–145)

## 2023-01-19 LAB — LIPID PANEL
Cholesterol: 243 mg/dL — ABNORMAL HIGH (ref 0–200)
HDL: 52.2 mg/dL (ref >39.00)
LDL Cholesterol: 167 mg/dL — ABNORMAL HIGH (ref 0–99)
NonHDL: 190.69
Total CHOL/HDL Ratio: 5
Triglycerides: 119 mg/dL (ref 0.0–149.0)
VLDL: 23.8 mg/dL (ref 0.0–40.0)

## 2023-01-19 LAB — PSA: PSA: 0.78 ng/mL (ref 0.10–4.00)

## 2023-01-19 LAB — HEMOGLOBIN A1C: Hgb A1c MFr Bld: 6.3 % (ref 4.6–6.5)

## 2023-01-19 LAB — VITAMIN D 25 HYDROXY (VIT D DEFICIENCY, FRACTURES): VITD: 25.51 ng/mL — ABNORMAL LOW (ref 30.00–100.00)

## 2023-01-19 NOTE — Progress Notes (Signed)
Established Patient Office Visit  Subjective   Patient ID: Curtis Rodriguez, male    DOB: 1965-07-18  Age: 57 y.o. MRN: [id number]  Chief Complaint  Patient presents with   Annual Exam    HPI   Curtis Rodriguez is seen for physical exam.  He is generally healthy 57 year old male.  Very health-conscious.  Follows low glycemic diet.  He practices yoga regularly.  Exercises regularly and tries to get at least 30 miles per week between walking and running.  No major complaints recently.  He had Cologuard last year which was normal.  Father was diagnosed with colon cancer around age 2.  Health maintenance reviewed:  Health Maintenance  Topic Date Due   Zoster Vaccines- Shingrix (1 of 2) Never done   Colonoscopy  12/30/2017   INFLUENZA VACCINE  12/10/2022   COVID-19 Vaccine (5 - 2023-24 season) 01/10/2023   DTaP/Tdap/Td (2 - Td or Tdap) 12/29/2026   Hepatitis C Screening  Completed   HIV Screening  Completed   HPV VACCINES  Aged Out    Family history-mother with history of type 2 diabetes and hypertension.  Father with history of colon cancer.  He has a brother who is generally healthy.   Social history-he is married.  Non-smoker.  No regular alcohol.  Works for [employer].  He has 66 year old son is 99 year old daughter.  His son lives with him.  Past Medical History:  Diagnosis Date   Anxiety state, unspecified 01/25/2009   BACK PAIN, UPPER 06/04/2010   FATIGUE 11/07/2009   HYPERLIPIDEMIA 01/15/2009   no meds   HYPERTHYROIDISM 11/14/2009   LOW BLOOD PRESSURE 11/07/2009   PREDIABETES 01/25/2009   REACTIVE AIRWAY DISEASE 03/19/2009   Past Surgical History:  Procedure Laterality Date   COLONOSCOPY  2009   Unsure per pt.    reports that he has been smoking cigars. He has a 17.6 pack-year smoking history. He has never used smokeless tobacco. He reports current alcohol use. He reports that he does not currently use drugs. family history includes Diabetes in his mother; Hypertension  in his mother. Allergies  Allergen Reactions   Sulfites Rash    Sulfates/sulfites    Review of Systems  Constitutional:  Negative for chills, fever, malaise/fatigue and weight loss.  HENT:  Negative for hearing loss.   Eyes:  Negative for blurred vision and double vision.  Respiratory:  Negative for cough and shortness of breath.   Cardiovascular:  Negative for chest pain, palpitations and leg swelling.  Gastrointestinal:  Negative for abdominal pain, blood in stool, constipation and diarrhea.  Genitourinary:  Negative for dysuria.  Skin:  Negative for rash.  Neurological:  Negative for dizziness, speech change, seizures, loss of consciousness and headaches.  Psychiatric/Behavioral:  Negative for depression.       Objective:     BP 124/80 (BP Location: Left Arm, Patient Position: Sitting, Cuff Size: Normal)   Pulse 60   Temp (!) 97.5 F (36.4 C) (Oral)   Ht 5' 7.72" (1.72 m)   Wt 166 lb 4.8 oz (75.4 kg)   SpO2 100%   BMI 25.50 kg/m  BP Readings from Last 3 Encounters:  01/19/23 124/80  10/30/22 110/82  05/12/22 116/80   Wt Readings from Last 3 Encounters:  01/19/23 166 lb 4.8 oz (75.4 kg)  10/30/22 162 lb 11.2 oz (73.8 kg)  05/12/22 171 lb 3.2 oz (77.7 kg)      Physical Exam Vitals reviewed.  Constitutional:      General:  He is not in acute distress.    Appearance: Normal appearance. He is well-developed.  HENT:     Head: Normocephalic and atraumatic.     Right Ear: External ear normal.     Left Ear: External ear normal.  Eyes:     Conjunctiva/sclera: Conjunctivae normal.     Pupils: Pupils are equal, round, and reactive to light.  Neck:     Thyroid: No thyromegaly.  Cardiovascular:     Rate and Rhythm: Normal rate and regular rhythm.     Heart sounds: Normal heart sounds. No murmur heard. Pulmonary:     Effort: No respiratory distress.     Breath sounds: No wheezing or rales.  Abdominal:     General: Bowel sounds are normal. There is no distension.      Palpations: Abdomen is soft. There is no mass.     Tenderness: There is no abdominal tenderness. There is no guarding or rebound.  Musculoskeletal:     Cervical back: Normal range of motion and neck supple.     Right lower leg: No edema.     Left lower leg: No edema.  Lymphadenopathy:     Cervical: No cervical adenopathy.  Skin:    Findings: No rash.  Neurological:     Mental Status: He is alert and oriented to person, place, and time.     Cranial Nerves: No cranial nerve deficit.      No results found for any visits on 01/19/23.  Last CBC Lab Results  Component Value Date   WBC 4.4 01/16/2022   HGB 14.0 01/16/2022   HCT 42.4 01/16/2022   MCV 86.5 01/16/2022   MCH 28.4 01/12/2020   RDW 14.3 01/16/2022   PLT 160.0 01/16/2022   Last metabolic panel Lab Results  Component Value Date   GLUCOSE 107 (H) 01/16/2022   NA 140 01/16/2022   K 4.5 01/16/2022   CL 104 01/16/2022   CO2 30 01/16/2022   BUN 15 01/16/2022   CREATININE 1.14 01/16/2022   GFR 73.04 01/16/2022   CALCIUM 9.3 01/16/2022   PROT 6.9 01/16/2022   ALBUMIN 4.1 01/16/2022   BILITOT 0.4 01/16/2022   ALKPHOS 41 01/16/2022   AST 21 01/16/2022   ALT 21 01/16/2022   Last lipids Lab Results  Component Value Date   CHOL 245 (H) 01/16/2022   HDL 46.10 01/16/2022   LDLCALC 173 (H) 01/16/2022   LDLDIRECT 138.0 12/01/2012   TRIG 130.0 01/16/2022   CHOLHDL 5 01/16/2022   Last hemoglobin A1c Lab Results  Component Value Date   HGBA1C 6.3 01/23/2022   Last thyroid functions Lab Results  Component Value Date   TSH 3.24 01/16/2022   Last vitamin D Lab Results  Component Value Date   VD25OH 29.13 (L) 01/23/2022      The 10-year ASCVD risk score (Arnett DK, et al., 2019) is: 13.2%    Assessment & Plan:   Problem List Items Addressed This Visit       Unprioritized   Hyperlipidemia   Relevant Orders   CT CARDIAC SCORING (SELF PAY ONLY)   Other Visit Diagnoses     Physical exam    -   Primary   Relevant Orders   Basic metabolic panel   Lipid panel   CBC with Differential/Platelet   Hepatic function panel   PSA   Prediabetes       Relevant Orders   Hemoglobin A1c   Vitamin D deficiency  Relevant Orders   VITAMIN D 25 Hydroxy (Vit-D Deficiency, Fractures)     Generally healthy 57 year old male.  He does have strong family history of diabetes and hypertension.  We discussed further restratification with coronary calcium score particularly in view of his high lipids.  Previous LDL cholesterol over 170.  He has been reluctant to consider statins but would reconsider if coronary calcium high.  His 10-year calculated risk for CAD is 13.2%.  -We also suggested that he consider repeat colonoscopy next year to with family history of colon cancer.  Would prefer colonoscopy screening over Cologuard with his family history  -He plans to get flu vaccine later this fall in October  -Also recommend he consider Shingrix vaccine  No follow-ups on file.    Evelena Peat, MD

## 2023-01-19 NOTE — Patient Instructions (Signed)
Remember to get flu vaccine this Fall  Consider Shingrix vaccine at some point this year.  I would consider getting colonoscopy at some point this year.  I am setting coronary calcium score

## 2023-01-22 ENCOUNTER — Ambulatory Visit (HOSPITAL_COMMUNITY)
Admission: RE | Admit: 2023-01-22 | Discharge: 2023-01-22 | Disposition: A | Discharge status: Home / Self Care | Payer: BC Managed Care – PPO | Source: Ambulatory Visit | Attending: Family Medicine | Admitting: Family Medicine

## 2023-01-22 ENCOUNTER — Ambulatory Visit (HOSPITAL_COMMUNITY): Payer: BC Managed Care – PPO

## 2023-01-22 DIAGNOSIS — E785 Hyperlipidemia, unspecified: Secondary | ICD-10-CM

## 2023-02-25 ENCOUNTER — Telehealth: Payer: Self-pay | Admitting: Family Medicine

## 2023-02-25 ENCOUNTER — Encounter: Payer: Self-pay | Admitting: Family Medicine

## 2023-02-25 MED ORDER — ESCITALOPRAM OXALATE 5 MG PO TABS: 5.0000 mg | ORAL_TABLET | Freq: Every day | ORAL | 1 refills | Status: DC

## 2023-02-25 NOTE — Telephone Encounter (Signed)
Prescription Request  02/25/2023  LOV: 01/19/2023  What is the name of the medication or equipment? LEXAPRO 5 MG tablet  Pt stated he does not want the generic Pt states he is completely out of this Rx Pt informed MD is OOO Pt is asking if another provider can assist?  Have you contacted your pharmacy to request a refill? No   Which pharmacy would you like this sent to?   [facility] [address] [place], [place] -  [address] [place] [address] Phone: [phone] Fax: [phone]  Patient notified that their request is being sent to the clinical staff for review and that they should receive a response within 2 business days.   Please advise at Mobile [phone] (mobile)

## 2023-02-25 NOTE — Telephone Encounter (Signed)
Rx sent 

## 2023-02-26 MED ORDER — LEXAPRO 5 MG PO TABS: 5.0000 mg | ORAL_TABLET | Freq: Every day | ORAL | 1 refills | Status: DC

## 2023-04-05 ENCOUNTER — Ambulatory Visit (INDEPENDENT_AMBULATORY_CARE_PROVIDER_SITE_OTHER): Payer: BC Managed Care – PPO | Admitting: Family Medicine

## 2023-04-05 ENCOUNTER — Encounter: Payer: Self-pay | Admitting: Family Medicine

## 2023-04-05 ENCOUNTER — Telehealth: Payer: Self-pay | Admitting: Family Medicine

## 2023-04-05 VITALS — BP 110/76 | HR 75 | Temp 98.0°F | Ht 67.72 in | Wt 164.5 lb

## 2023-04-05 DIAGNOSIS — F411 Generalized anxiety disorder: Secondary | ICD-10-CM

## 2023-04-05 DIAGNOSIS — Z021 Encounter for pre-employment examination: Secondary | ICD-10-CM

## 2023-04-05 MED ORDER — LEXAPRO 5 MG PO TABS: 5.0000 mg | ORAL_TABLET | Freq: Every day | ORAL | 3 refills | Status: DC

## 2023-04-05 MED ORDER — FLUTICASONE PROPIONATE 50 MCG/ACT NA SUSP: 2.0000 | Freq: Every day | NASAL | 3 refills | Status: DC

## 2023-04-05 NOTE — Telephone Encounter (Signed)
Noted  

## 2023-04-05 NOTE — Progress Notes (Signed)
Established Patient Office Visit  Subjective   Patient ID: Curtis Rodriguez, male    DOB: 1966/04/11  Age: 57 y.o. MRN: [id number]  Chief Complaint  Patient presents with   Medical Management of Chronic Issues    HPI   Curtis Rodriguez is seen today for the following items  He has history of some chronic neck and upper back pain intermittently and takes very low-dose Lexapro which is surprisingly helped him for several years.  He is requesting refill but brand-name only.  He feels like he does not have his good response with generic.  Also requesting refills of Flonase.  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  He is requesting drug screen.  He has no history whatsoever of illicit drug use.  Non-smoker.  Very health-conscious.  Exercises regularly.  Still runs regularly.  Past Medical History:  Diagnosis Date   Anxiety state, unspecified 01/25/2009   BACK PAIN, UPPER 06/04/2010   FATIGUE 11/07/2009   HYPERLIPIDEMIA 01/15/2009   no meds   HYPERTHYROIDISM 11/14/2009   LOW BLOOD PRESSURE 11/07/2009   PREDIABETES 01/25/2009   REACTIVE AIRWAY DISEASE 03/19/2009   Past Surgical History:  Procedure Laterality Date   COLONOSCOPY  2009   Unsure per pt.    reports that he has been smoking cigars. He has a 17.6 pack-year smoking history. He has never used smokeless tobacco. He reports current alcohol use. He reports that he does not currently use drugs. family history includes Diabetes in his mother; Hypertension in his mother. Allergies  Allergen Reactions   Sulfites Rash    Sulfates/sulfites    Review of Systems  Constitutional:  Negative for malaise/fatigue.  Eyes:  Negative for blurred vision.  Respiratory:  Negative for shortness of breath.   Cardiovascular:  Negative for chest pain.  Neurological:  Negative for dizziness, weakness and headaches.      Objective:     BP 110/76 (BP Location: Left Arm, Patient Position: Sitting, Cuff Size: Normal)   Pulse 75   Temp 98 F (36.7 C)  (Oral)   Ht 5' 7.72" (1.72 m)   Wt 164 lb 8 oz (74.6 kg)   SpO2 98%   BMI 25.22 kg/m  BP Readings from Last 3 Encounters:  04/05/23 110/76  01/19/23 124/80  10/30/22 110/82   Wt Readings from Last 3 Encounters:  04/05/23 164 lb 8 oz (74.6 kg)  01/19/23 166 lb 4.8 oz (75.4 kg)  10/30/22 162 lb 11.2 oz (73.8 kg)      Physical Exam Vitals reviewed.  Constitutional:      General: He is not in acute distress.    Appearance: He is not ill-appearing.  Cardiovascular:     Rate and Rhythm: Normal rate and regular rhythm.  Pulmonary:     Effort: Pulmonary effort is normal.     Breath sounds: Normal breath sounds. No wheezing or rales.  Neurological:     Mental Status: He is alert.      No results found for any visits on 04/05/23.  Last CBC Lab Results  Component Value Date   WBC 3.8 (L) 01/19/2023   HGB 13.5 01/19/2023   HCT 41.6 01/19/2023   MCV 88.1 01/19/2023   MCH 28.4 01/12/2020   RDW 14.4 01/19/2023   PLT 186.0 01/19/2023   Last metabolic panel Lab Results  Component Value Date   GLUCOSE 99 01/19/2023   NA 139 01/19/2023   K 4.1 01/19/2023   CL 105 01/19/2023   CO2 28 01/19/2023  BUN 14 01/19/2023   CREATININE 1.06 01/19/2023   GFR 79.14 01/19/2023   CALCIUM 8.9 01/19/2023   PROT 6.9 01/19/2023   ALBUMIN 4.1 01/19/2023   BILITOT 0.4 01/19/2023   ALKPHOS 40 01/19/2023   AST 18 01/19/2023   ALT 16 01/19/2023   Last lipids Lab Results  Component Value Date   CHOL 243 (H) 01/19/2023   HDL 52.20 01/19/2023   LDLCALC 167 (H) 01/19/2023   LDLDIRECT 138.0 12/01/2012   TRIG 119.0 01/19/2023   CHOLHDL 5 01/19/2023   Last hemoglobin A1c Lab Results  Component Value Date   HGBA1C 6.3 01/19/2023      The 10-year ASCVD risk score (Arnett DK, et al., 2019) is: 9.7%    Assessment & Plan:   #1 history of mild anxiety symptoms.  Patient has been on low-dose Lexapro for several years.  Requesting refill for brand-name only and prescription sent.  #2  history of seasonal allergic rhinitis.  Refill Flonase for as needed use  #3 patient requesting drug screen for preemployment.  This was obtained today   No follow-ups on file.    Evelena Peat, MD

## 2023-04-05 NOTE — Telephone Encounter (Signed)
Pt is coming in this afternoon for an OV. Pt would like a Drug Screening for Employment. He does not have any forms from his work. Is there anything that will hinder him from getting labs done? Please advise.

## 2023-04-07 ENCOUNTER — Telehealth: Payer: Self-pay | Admitting: Family Medicine

## 2023-04-07 LAB — DRUG MONITORING, PANEL 8 WITH CONFIRMATION, URINE
6 Acetylmorphine: NEGATIVE ng/mL (ref <10)
Alcohol Metabolites: POSITIVE ng/mL — AB (ref <500)
Amphetamines: NEGATIVE ng/mL (ref <500)
Benzodiazepines: NEGATIVE ng/mL (ref <100)
Buprenorphine, Urine: NEGATIVE ng/mL (ref <5)
Cocaine Metabolite: NEGATIVE ng/mL (ref <150)
Creatinine: 58.4 mg/dL (ref >=20.0)
Ethyl Glucuronide (ETG): 2450 ng/mL — ABNORMAL HIGH (ref <500)
Ethyl Sulfate (ETS): 824 ng/mL — ABNORMAL HIGH (ref <100)
MDMA: NEGATIVE ng/mL (ref <500)
Marijuana Metabolite: NEGATIVE ng/mL (ref <20)
Opiates: NEGATIVE ng/mL (ref <100)
Oxidant: NEGATIVE ug/mL (ref <200)
Oxycodone: NEGATIVE ng/mL (ref <100)
pH: 5.7 (ref 4.5–9.0)

## 2023-04-07 LAB — DM TEMPLATE

## 2023-04-07 NOTE — Telephone Encounter (Signed)
Requesting a call to discuss his labs and a print out of lab results

## 2023-04-12 ENCOUNTER — Ambulatory Visit: Payer: BC Managed Care – PPO | Admitting: Family Medicine

## 2023-04-12 ENCOUNTER — Encounter: Payer: Self-pay | Admitting: Family Medicine

## 2023-04-12 ENCOUNTER — Ambulatory Visit (INDEPENDENT_AMBULATORY_CARE_PROVIDER_SITE_OTHER): Payer: BC Managed Care – PPO | Admitting: Family Medicine

## 2023-04-12 VITALS — BP 114/70 | HR 60 | Temp 98.0°F | Ht 67.0 in | Wt 164.7 lb

## 2023-04-12 DIAGNOSIS — Z021 Encounter for pre-employment examination: Secondary | ICD-10-CM | POA: Diagnosis not present

## 2023-04-12 NOTE — Telephone Encounter (Signed)
Please see result note 

## 2023-04-12 NOTE — Progress Notes (Signed)
Established Patient Office Visit  Subjective   Patient ID: Curtis Rodriguez, male    DOB: 09/01/1965  Age: 57 y.o. MRN: [id number]  Chief Complaint  Patient presents with   Follow-up    HPI   Curtis Rodriguez is seen today to discuss recent positive drug screen for alcohol metabolites.   This was positive for ethyl glucuronide and ethyl sulfate.  He does recall having some scotch, several ounces, couple days prior to the screen.  Other screens were all negative.  He has no issues whatsoever with alcohol control or alcohol abuse.  Does not drink regularly.  He is requesting repeat drug screen now about 3 to 4 days since any alcohol consumption.  No history of illicit drug use.  Remainder of his recent drug screen was negative.   Past Medical History:  Diagnosis Date   Anxiety state, unspecified 01/25/2009   BACK PAIN, UPPER 06/04/2010   FATIGUE 11/07/2009   HYPERLIPIDEMIA 01/15/2009   no meds   HYPERTHYROIDISM 11/14/2009   LOW BLOOD PRESSURE 11/07/2009   PREDIABETES 01/25/2009   REACTIVE AIRWAY DISEASE 03/19/2009   Past Surgical History:  Procedure Laterality Date   COLONOSCOPY  2009   Unsure per pt.    reports that he has been smoking cigars. He has a 17.6 pack-year smoking history. He has never used smokeless tobacco. He reports current alcohol use. He reports that he does not currently use drugs. family history includes Diabetes in his mother; Hypertension in his mother. Allergies  Allergen Reactions   Sulfites Rash    Sulfates/sulfites    Review of Systems  Constitutional:  Negative for malaise/fatigue.  Eyes:  Negative for blurred vision.  Respiratory:  Negative for shortness of breath.   Cardiovascular:  Negative for chest pain.  Neurological:  Negative for dizziness, weakness and headaches.      Objective:     BP 114/70 (BP Location: Left Arm, Patient Position: Sitting, Cuff Size: Normal)   Pulse 60   Temp 98 F (36.7 C) (Oral)   Ht 5\' 7"  (1.702 m)   Wt 164 lb 11.2 oz (74.7 kg)    SpO2 99%   BMI 25.80 kg/m  BP Readings from Last 3 Encounters:  04/12/23 114/70  04/05/23 110/76  01/19/23 124/80   Wt Readings from Last 3 Encounters:  04/12/23 164 lb 11.2 oz (74.7 kg)  04/05/23 164 lb 8 oz (74.6 kg)  01/19/23 166 lb 4.8 oz (75.4 kg)      Physical Exam Vitals reviewed.  Constitutional:      General: He is not in acute distress. Cardiovascular:     Rate and Rhythm: Normal rate and regular rhythm.  Neurological:     Mental Status: He is alert.      No results found for any visits on 04/12/23.    The 10-year ASCVD risk score (Arnett DK, et al., 2019) is: 10.2%    Assessment & Plan:   Problem List Items Addressed This Visit   None Visit Diagnoses     Pre-employment health screening examination    -  Primary   Relevant Orders   DRUG MONITORING, PANEL 8 WITH CONFIRMATION, URINE     Patient had recent pre-employment drug screening positive for alcohol metabolites.  He does not consume alcohol regularly but rather socially.  He is requesting repeat drug screen today about 72 to 96 hours since last alcohol consumption.  We did discuss the fact that alcohol metabolites can last up to 80 hours or so.  Remainder  of drug screen was negative  No follow-ups on file.    Evelena Peat, MD

## 2023-04-14 ENCOUNTER — Encounter: Payer: Self-pay | Admitting: Family Medicine

## 2023-04-14 LAB — DRUG MONITORING, PANEL 8 WITH CONFIRMATION, URINE
6 Acetylmorphine: NEGATIVE ng/mL (ref <10)
Alcohol Metabolites: NEGATIVE ng/mL (ref <500)
Amphetamines: NEGATIVE ng/mL (ref <500)
Benzodiazepines: NEGATIVE ng/mL (ref <100)
Buprenorphine, Urine: NEGATIVE ng/mL (ref <5)
Cocaine Metabolite: NEGATIVE ng/mL (ref <150)
Creatinine: 48.6 mg/dL (ref >=20.0)
MDMA: NEGATIVE ng/mL (ref <500)
Marijuana Metabolite: NEGATIVE ng/mL (ref <20)
Opiates: NEGATIVE ng/mL (ref <100)
Oxidant: NEGATIVE ug/mL (ref <200)
Oxycodone: NEGATIVE ng/mL (ref <100)
pH: 5.6 (ref 4.5–9.0)

## 2023-04-14 LAB — DM TEMPLATE

## 2023-07-02 ENCOUNTER — Other Ambulatory Visit: Payer: Self-pay | Admitting: Family Medicine

## 2023-09-03 ENCOUNTER — Ambulatory Visit: Payer: Self-pay

## 2023-09-03 NOTE — Telephone Encounter (Signed)
  Chief Complaint: sore throat Symptoms: nasal congestion, sore throat Frequency: x 1 week Pertinent Negatives: Patient denies cough, chest pain, SOB Disposition: [] ED /[] Urgent Care (no appt availability in office) / [x] Appointment(In office/virtual)/ []  [facility]/ [] Home Care/ [] Refused Recommended Disposition /[] [facility]/ []  Follow-up with PCP Additional Notes: Patient states he usually takes Muccinex but states it has not improved his symptoms. Patient had recent travel over the past few weeks to [place] , [place]  [place] and [place] . He states he is unsure if he picked up something during his travels. Patient offered acute appointments on Monday with available providers and declined, states he would like to wait til Tuesday to see his PCP. Appt added to waitlist.  Summary: sore throat, congestion   Copied From CRM [id number]. Reason for Triage: sore throat, congestion         Reason for Disposition  [1] Sore throat with cough/cold symptoms AND [2] present > 5 days  Answer Assessment - Initial Assessment Questions 1. ONSET: "When did the throat start hurting?" (Hours or days ago)      X 1 week.  2. SEVERITY: "How bad is the sore throat?" (Scale 1-10; mild, moderate or severe)   - MILD (1-3):  Doesn't interfere with eating or normal activities.   - MODERATE (4-7): Interferes with eating some solids and normal activities.   - SEVERE (8-10):  Excruciating pain, interferes with most normal activities.   - SEVERE WITH DYSPHAGIA (10): Can't swallow liquids, drooling.     5/10.  3. STREP EXPOSURE: "Has there been any exposure to strep within the past week?" If Yes, ask: "What type of contact occurred?"      Denies known exposure but states he has been travelling a lot over the past few weeks.  4.  VIRAL SYMPTOMS: "Are there any symptoms of a cold, such as a runny nose, cough, hoarse voice or red eyes?"      Nasal congestion.  5. FEVER: "Do you have a fever?" If  Yes, ask: "What is your temperature, how was it measured, and when did it start?"     Denies.  6. PUS ON THE TONSILS: "Is there pus on the tonsils in the back of your throat?"     Denies.  7. OTHER SYMPTOMS: "Do you have any other symptoms?" (e.g., difficulty breathing, headache, rash)     Felt sluggish the last few days.  8. PREGNANCY: "Is there any chance you are pregnant?" "When was your last menstrual period?"     N/A.  Protocols used: Sore Throat-A-AH

## 2023-09-07 ENCOUNTER — Ambulatory Visit: Payer: Self-pay | Admitting: Family Medicine

## 2023-11-04 ENCOUNTER — Telehealth: Payer: Self-pay | Admitting: Family Medicine

## 2023-11-04 MED ORDER — FLUTICASONE PROPIONATE 50 MCG/ACT NA SUSP: 2.0000 | Freq: Every day | NASAL | 1 refills | Status: DC

## 2023-11-04 NOTE — Telephone Encounter (Signed)
 Copied from CRM [id number]. Topic: Clinical - Medication Refill >> Nov 04, 2023  8:23 AM Sasha H wrote: Medication: fluticasone  (FLONASE ) 50 MCG/ACT nasal spray  Has the patient contacted their pharmacy? Yes (Agent: If no, request that the patient contact the pharmacy for the refill. If patient does not wish to contact the pharmacy document the reason why and proceed with request.) (Agent: If yes, when and what did the pharmacy advise?)  This is the patient's preferred pharmacy:  [id number] [place], [place] - [address] [place] [address] Phone: [phone] Fax: [phone]   Is this the correct pharmacy for this prescription? Yes If no, delete pharmacy and type the correct one.   Has the prescription been filled recently? Yes  Is the patient out of the medication? Yes  Has the patient been seen for an appointment in the last year OR does the patient have an upcoming appointment? No  Can we respond through MyChart? Yes  Agent: Please be advised that Rx refills may take up to 3 business days. We ask that you follow-up with your pharmacy.

## 2024-01-21 ENCOUNTER — Ambulatory Visit: Payer: Self-pay | Admitting: Family Medicine

## 2024-01-21 ENCOUNTER — Encounter: Payer: Self-pay | Admitting: Family Medicine

## 2024-01-21 ENCOUNTER — Ambulatory Visit (INDEPENDENT_AMBULATORY_CARE_PROVIDER_SITE_OTHER): Payer: Self-pay | Admitting: Family Medicine

## 2024-01-21 VITALS — BP 100/70 | HR 59 | Temp 97.8°F | Ht 67.0 in | Wt 163.7 lb

## 2024-01-21 DIAGNOSIS — E559 Vitamin D deficiency, unspecified: Secondary | ICD-10-CM | POA: Diagnosis not present

## 2024-01-21 DIAGNOSIS — Z1211 Encounter for screening for malignant neoplasm of colon: Secondary | ICD-10-CM

## 2024-01-21 DIAGNOSIS — Z23 Encounter for immunization: Secondary | ICD-10-CM | POA: Diagnosis not present

## 2024-01-21 DIAGNOSIS — R7309 Other abnormal glucose: Secondary | ICD-10-CM

## 2024-01-21 DIAGNOSIS — Z0000 Encounter for general adult medical examination without abnormal findings: Secondary | ICD-10-CM | POA: Diagnosis not present

## 2024-01-21 LAB — HEMOGLOBIN A1C: Hgb A1c MFr Bld: 6.5 % (ref 4.6–6.5)

## 2024-01-21 LAB — LIPID PANEL
Cholesterol: 235 mg/dL — ABNORMAL HIGH (ref 0–200)
HDL: 45.4 mg/dL (ref >39.00)
LDL Cholesterol: 169 mg/dL — ABNORMAL HIGH (ref 0–99)
NonHDL: 190.01
Total CHOL/HDL Ratio: 5
Triglycerides: 106 mg/dL (ref 0.0–149.0)
VLDL: 21.2 mg/dL (ref 0.0–40.0)

## 2024-01-21 LAB — CBC WITH DIFFERENTIAL/PLATELET
Basophils Absolute: 0 K/uL (ref 0.0–0.1)
Basophils Relative: 0.7 % (ref 0.0–3.0)
Eosinophils Absolute: 0.2 K/uL (ref 0.0–0.7)
Eosinophils Relative: 5.4 % — ABNORMAL HIGH (ref 0.0–5.0)
HCT: 39.7 % (ref 39.0–52.0)
Hemoglobin: 13.1 g/dL (ref 13.0–17.0)
Lymphocytes Relative: 36.2 % (ref 12.0–46.0)
Lymphs Abs: 1.2 K/uL (ref 0.7–4.0)
MCHC: 33 g/dL (ref 30.0–36.0)
MCV: 86 fl (ref 78.0–100.0)
Monocytes Absolute: 0.4 K/uL (ref 0.1–1.0)
Monocytes Relative: 10.8 % (ref 3.0–12.0)
Neutro Abs: 1.6 K/uL (ref 1.4–7.7)
Neutrophils Relative %: 46.9 % (ref 43.0–77.0)
Platelets: 182 K/uL (ref 150.0–400.0)
RBC: 4.61 Mil/uL (ref 4.22–5.81)
RDW: 13.8 % (ref 11.5–15.5)
WBC: 3.3 K/uL — ABNORMAL LOW (ref 4.0–10.5)

## 2024-01-21 LAB — BASIC METABOLIC PANEL WITH GFR
BUN: 17 mg/dL (ref 6–23)
CO2: 27 meq/L (ref 19–32)
Calcium: 9.3 mg/dL (ref 8.4–10.5)
Chloride: 105 meq/L (ref 96–112)
Creatinine, Ser: 1.03 mg/dL (ref 0.40–1.50)
GFR: 81.34 mL/min (ref >60.00)
Glucose, Bld: 100 mg/dL — ABNORMAL HIGH (ref 70–99)
Potassium: 4.5 meq/L (ref 3.5–5.1)
Sodium: 140 meq/L (ref 135–145)

## 2024-01-21 LAB — HEPATIC FUNCTION PANEL
ALT: 20 U/L (ref 0–53)
AST: 21 U/L (ref 0–37)
Albumin: 4.3 g/dL (ref 3.5–5.2)
Alkaline Phosphatase: 33 U/L — ABNORMAL LOW (ref 39–117)
Bilirubin, Direct: 0.1 mg/dL (ref 0.0–0.3)
Total Bilirubin: 0.3 mg/dL (ref 0.2–1.2)
Total Protein: 6.8 g/dL (ref 6.0–8.3)

## 2024-01-21 LAB — PSA: PSA: 0.8 ng/mL (ref 0.10–4.00)

## 2024-01-21 LAB — TSH: TSH: 2.91 u[IU]/mL (ref 0.35–5.50)

## 2024-01-21 LAB — VITAMIN D 25 HYDROXY (VIT D DEFICIENCY, FRACTURES): VITD: 27.5 ng/mL — ABNORMAL LOW (ref 30.00–100.00)

## 2024-01-21 NOTE — Progress Notes (Signed)
 Established Patient Office Visit  Subjective   Patient ID: Curtis Rodriguez, male    DOB: Jul 28, 1965  Age: 58 y.o. MRN: [id number]  Chief Complaint  Patient presents with   Annual Exam    HPI   Curtis Rodriguez is here for physical exam.  Very health-conscious.  He does have history of prediabetes, vitamin D  deficiency, hyperlipidemia.  Has some chronic neck and upper back difficulties which are currently stable.  Does combination of exercise with jogging, resistance training, yoga.  Currently takes no regular medications other than fraction of a Lexapro  daily  Health maintenance reviewed:  Health Maintenance  Topic Date Due   Pneumococcal Vaccine: 50+ Years (1 of 2 - PCV) Never done   Hepatitis B Vaccines 19-59 Average Risk (1 of 3 - 19+ 3-dose series) Never done   Zoster Vaccines- Shingrix (1 of 2) Never done   COVID-19 Vaccine (5 - 2025-26 season) 01/10/2024   Colonoscopy  01/22/2026 (Originally 12/30/2017)   DTaP/Tdap/Td (2 - Td or Tdap) 12/29/2026   Influenza Vaccine  Completed   Hepatitis C Screening  Completed   HIV Screening  Completed   HPV VACCINES  Aged Out   Meningococcal B Vaccine  Aged Out   - There is some question of father having colon cancer and patient did have Cologuard last year but apparently we were not aware at the time of ordering that he had family history of colon cancer.  He is requesting referral for colonoscopy.  Social history-married.  He has a son who just moved to [place] recently.  Daughter just started [employer]  Non-smoker.  Occasional alcohol socially but not on a regular basis.  He did start work with a new company this past January.  Plans to travel to [place] soon to visit family  Family history-mother is 30 and has history of type 2 diabetes hypertension.  Father 31 and questionable history of colon cancer.  He has a brother who has COPD but is a heavy smoker  Past Medical History:  Diagnosis Date   Anxiety state, unspecified 01/25/2009   BACK  PAIN, UPPER 06/04/2010   FATIGUE 11/07/2009   HYPERLIPIDEMIA 01/15/2009   no meds   HYPERTHYROIDISM 11/14/2009   LOW BLOOD PRESSURE 11/07/2009   PREDIABETES 01/25/2009   REACTIVE AIRWAY DISEASE 03/19/2009   Past Surgical History:  Procedure Laterality Date   COLONOSCOPY  2009   Unsure per pt.    reports that he has been smoking cigars. He has a 17.6 pack-year smoking history. He has never used smokeless tobacco. He reports current alcohol use. He reports that he does not currently use drugs. family history includes Diabetes in his mother; Hypertension in his mother. Allergies  Allergen Reactions   Sulfites Rash    Sulfates/sulfites    Review of Systems  Constitutional:  Negative for chills, fever, malaise/fatigue and weight loss.  HENT:  Negative for hearing loss.   Eyes:  Negative for blurred vision and double vision.  Respiratory:  Negative for cough and shortness of breath.   Cardiovascular:  Negative for chest pain, palpitations and leg swelling.  Gastrointestinal:  Negative for abdominal pain, blood in stool, constipation and diarrhea.  Genitourinary:  Negative for dysuria.  Skin:  Negative for rash.  Neurological:  Negative for dizziness, speech change, seizures, loss of consciousness and headaches.  Psychiatric/Behavioral:  Negative for depression.       Objective:     BP 100/70   Pulse (!) 59   Temp 97.8 F (36.6  C) (Oral)   Ht 5' 7 (1.702 m)   Wt 163 lb 11.2 oz (74.3 kg)   SpO2 97%   BMI 25.64 kg/m  BP Readings from Last 3 Encounters:  01/21/24 100/70  04/12/23 114/70  04/05/23 110/76   Wt Readings from Last 3 Encounters:  01/21/24 163 lb 11.2 oz (74.3 kg)  04/12/23 164 lb 11.2 oz (74.7 kg)  04/05/23 164 lb 8 oz (74.6 kg)      Physical Exam Vitals reviewed.  Constitutional:      General: He is not in acute distress.    Appearance: He is well-developed.  HENT:     Head: Normocephalic and atraumatic.     Right Ear: External ear normal.     Left  Ear: External ear normal.  Eyes:     Conjunctiva/sclera: Conjunctivae normal.     Pupils: Pupils are equal, round, and reactive to light.  Neck:     Thyroid : No thyromegaly.  Cardiovascular:     Rate and Rhythm: Normal rate and regular rhythm.     Heart sounds: Normal heart sounds. No murmur heard. Pulmonary:     Effort: No respiratory distress.     Breath sounds: No wheezing or rales.  Abdominal:     General: Bowel sounds are normal. There is no distension.     Palpations: Abdomen is soft. There is no mass.     Tenderness: There is no abdominal tenderness. There is no guarding or rebound.  Musculoskeletal:     Cervical back: Normal range of motion and neck supple.     Right lower leg: No edema.     Left lower leg: No edema.  Lymphadenopathy:     Cervical: No cervical adenopathy.  Skin:    Findings: No rash.  Neurological:     Mental Status: He is alert and oriented to person, place, and time.     Cranial Nerves: No cranial nerve deficit.      No results found for any visits on 01/21/24.    The 10-year ASCVD risk score (Arnett DK, et al., 2019) is: 8.6%    Assessment & Plan:   Problem List Items Addressed This Visit       Unprioritized   PREDIABETES   Relevant Orders   Hemoglobin A1c   Other Visit Diagnoses       Physical exam    -  Primary   Relevant Orders   Basic metabolic panel with GFR   Lipid panel   CBC with Differential/Platelet   TSH   Hepatic function panel   PSA     Vitamin D  deficiency       Relevant Orders   VITAMIN D  25 Hydroxy (Vit-D Deficiency, Fractures)     Colon cancer screening       Relevant Orders   Ambulatory referral to Gastroenterology     Need for influenza vaccination       Relevant Orders   Flu vaccine trivalent PF, 6mos and older(Flulaval,Afluria,Fluarix,Fluzone) (Completed)     Here for physical exam.  Chronic stable problems as above.  He is very health-conscious.  Exercises regularly.  We discussed the following  health maintenance items  -Obtain lab work as above - Recommend flu vaccine - Patient requesting referral for colonoscopy.  Does have reported family history of colon cancer in his father.  His last colonoscopy was years ago. - Continue regular exercise habits  No follow-ups on file.    Wolm Scarlet, MD

## 2024-02-15 ENCOUNTER — Encounter: Payer: Self-pay | Admitting: Internal Medicine

## 2024-03-01 ENCOUNTER — Other Ambulatory Visit: Payer: Self-pay | Admitting: Family Medicine

## 2024-03-01 NOTE — Telephone Encounter (Unsigned)
 Copied from CRM [id number]. Topic: Clinical - Medication Refill >> Mar 01, 2024  8:30 AM Thersia C wrote: Medication: LEXAPRO  5 MG tablet   albuterol  (VENTOLIN  HFA) 108 (90 Base) MCG/ACT inhaler  fluticasone  (FLONASE ) 50 MCG/ACT nasal spray  loratadine  (CLARITIN ) 10 MG tablet  Has the patient contacted their pharmacy? Yes (Agent: If no, request that the patient contact the pharmacy for the refill. If patient does not wish to contact the pharmacy document the reason why and proceed with request.) (Agent: If yes, when and what did the pharmacy advise?)  This is the patient's preferred pharmacy:  [id number] [place], [place] - [address] [place] [address] Phone: [phone] Fax: [phone]  [facility] [place] [address] [place] [address] Phone: [phone] Fax: [phone]  Is this the correct pharmacy for this prescription? Yes If no, delete pharmacy and type the correct one.   Has the prescription been filled recently? No  Is the patient out of the medication? Yes  Has the patient been seen for an appointment in the last year OR does the patient have an upcoming appointment? Yes  Can we respond through MyChart? Yes  Agent: Please be advised that Rx refills may take up to 3 business days. We ask that you follow-up with your pharmacy.

## 2024-03-02 MED ORDER — LEXAPRO 5 MG PO TABS: 5.0000 mg | ORAL_TABLET | Freq: Every day | ORAL | 3 refills | Status: AC

## 2024-03-02 MED ORDER — LORATADINE 10 MG PO TABS: 10.0000 mg | ORAL_TABLET | Freq: Every day | ORAL | 11 refills | Status: AC

## 2024-03-02 MED ORDER — ALBUTEROL SULFATE HFA 108 (90 BASE) MCG/ACT IN AERS: INHALATION_SPRAY | RESPIRATORY_TRACT | 1 refills | Status: DC

## 2024-03-09 ENCOUNTER — Ambulatory Visit

## 2024-03-09 VITALS — Ht 68.0 in | Wt 161.0 lb

## 2024-03-09 DIAGNOSIS — Z1211 Encounter for screening for malignant neoplasm of colon: Secondary | ICD-10-CM

## 2024-03-09 DIAGNOSIS — Z800 Family history of malignant neoplasm of digestive organs: Secondary | ICD-10-CM

## 2024-03-09 MED ORDER — NA SULFATE-K SULFATE-MG SULF 17.5-3.13-1.6 GM/177ML PO SOLN: 1.0000 | Freq: Once | ORAL | 0 refills | Status: AC

## 2024-03-09 NOTE — Progress Notes (Signed)

## 2024-03-23 ENCOUNTER — Encounter: Admitting: Internal Medicine

## 2024-04-12 ENCOUNTER — Encounter: Admitting: Internal Medicine

## 2024-04-17 ENCOUNTER — Encounter: Admitting: Internal Medicine

## 2024-04-21 ENCOUNTER — Ambulatory Visit: Admitting: Family Medicine

## 2024-04-24 ENCOUNTER — Encounter: Admitting: Internal Medicine

## 2024-05-08 ENCOUNTER — Ambulatory Visit: Admitting: Family Medicine

## 2024-05-09 ENCOUNTER — Ambulatory Visit: Admitting: Family Medicine

## 2024-05-10 ENCOUNTER — Ambulatory Visit (INDEPENDENT_AMBULATORY_CARE_PROVIDER_SITE_OTHER): Admitting: Family Medicine

## 2024-05-10 ENCOUNTER — Encounter: Payer: Self-pay | Admitting: Family Medicine

## 2024-05-10 VITALS — BP 100/70 | HR 58 | Temp 97.9°F | Wt 165.3 lb

## 2024-05-10 DIAGNOSIS — E785 Hyperlipidemia, unspecified: Secondary | ICD-10-CM | POA: Diagnosis not present

## 2024-05-10 DIAGNOSIS — E559 Vitamin D deficiency, unspecified: Secondary | ICD-10-CM

## 2024-05-10 DIAGNOSIS — R7303 Prediabetes: Secondary | ICD-10-CM

## 2024-05-10 LAB — POCT GLYCOSYLATED HEMOGLOBIN (HGB A1C): Hemoglobin A1C: 6.2 % — AB (ref 4.0–5.6)

## 2024-05-10 MED ORDER — ALBUTEROL SULFATE HFA 108 (90 BASE) MCG/ACT IN AERS: INHALATION_SPRAY | RESPIRATORY_TRACT | 1 refills | Status: AC

## 2024-05-10 MED ORDER — FLUTICASONE PROPIONATE 50 MCG/ACT NA SUSP: 2.0000 | Freq: Every day | NASAL | 1 refills | Status: AC

## 2024-05-10 NOTE — Patient Instructions (Signed)
 A1C today improved to 6.2% (was 6.5%).

## 2024-05-10 NOTE — Progress Notes (Signed)
 "  Established Patient Office Visit  Subjective   Patient ID: Curtis Rodriguez, male    DOB: Nov 16, 1965  Age: 58 y.o. MRN: [id number]  No chief complaint on file.   HPI    Curtis Rodriguez is seen today for medical follow-up.  Very health-conscious.  He exercises regularly.  Just got back from [place] yesterday.  His father passed away at age 31 on 05/17/25.  His mom is still alive age 28.  Mom has history of type 2 diabetes.  [name]'s diet has been altered somewhat because of being in a different country for the past month.  He had recent A1c 6.5%.  We suggested 50-month follow-up.  He is very knowledgeable regarding diet and trying to reduce refined sugars and white starches.  Recent LDL cholesterol 169.  He had coronary calcium score 2024 of 0.  No family history of premature CAD.  His blood pressures have been excellent.  He had recent low vitamin D  of 27.  Does take some vitamin D  supplementation  Past Medical History:  Diagnosis Date   Allergy    BACK PAIN, UPPER 06/04/2010   FATIGUE 11/07/2009   HYPERLIPIDEMIA 01/15/2009   no meds   HYPERTHYROIDISM 11/14/2009   LOW BLOOD PRESSURE 11/07/2009   PREDIABETES 01/25/2009   REACTIVE AIRWAY DISEASE 03/19/2009   Past Surgical History:  Procedure Laterality Date   COLONOSCOPY  2009   Unsure per pt.    reports that he has quit smoking. His smoking use included cigarettes. He has never used smokeless tobacco. He reports current alcohol use. He reports that he does not currently use drugs. family history includes Colon cancer in his father; Colon polyps in his father; Diabetes in his mother; Hypertension in his mother. Allergies[1]  Review of Systems  Constitutional:  Negative for malaise/fatigue.  Eyes:  Negative for blurred vision.  Respiratory:  Negative for shortness of breath.   Cardiovascular:  Negative for chest pain.  Neurological:  Negative for dizziness, weakness and headaches.      Objective:     BP 100/70   Pulse (!) 58   Temp 97.9  F (36.6 C) (Oral)   Wt 165 lb 4.8 oz (75 kg)   SpO2 98%   BMI 25.13 kg/m  BP Readings from Last 3 Encounters:  05/10/24 100/70  01/21/24 100/70  04/12/23 114/70   Wt Readings from Last 3 Encounters:  05/10/24 165 lb 4.8 oz (75 kg)  03/09/24 161 lb (73 kg)  01/21/24 163 lb 11.2 oz (74.3 kg)      Physical Exam Vitals reviewed.  Constitutional:      General: He is not in acute distress.    Appearance: He is well-developed. He is not ill-appearing.  HENT:     Right Ear: External ear normal.     Left Ear: External ear normal.  Eyes:     Pupils: Pupils are equal, round, and reactive to light.  Neck:     Thyroid : No thyromegaly.  Cardiovascular:     Rate and Rhythm: Normal rate and regular rhythm.  Pulmonary:     Effort: Pulmonary effort is normal. No respiratory distress.     Breath sounds: Normal breath sounds. No wheezing or rales.  Musculoskeletal:     Cervical back: Neck supple.  Neurological:     Mental Status: He is alert and oriented to person, place, and time.      Results for orders placed or performed in visit on 05/10/24  POC HgB A1c  Result Value  Ref Range   Hemoglobin A1C 6.2 (A) 4.0 - 5.6 %   HbA1c POC (<> result, manual entry)     HbA1c, POC (prediabetic range)     HbA1c, POC (controlled diabetic range)      Last CBC Lab Results  Component Value Date   WBC 3.3 (L) 01/21/2024   HGB 13.1 01/21/2024   HCT 39.7 01/21/2024   MCV 86.0 01/21/2024   MCH 28.4 01/12/2020   RDW 13.8 01/21/2024   PLT 182.0 01/21/2024   Last metabolic panel Lab Results  Component Value Date   GLUCOSE 100 (H) 01/21/2024   NA 140 01/21/2024   K 4.5 01/21/2024   CL 105 01/21/2024   CO2 27 01/21/2024   BUN 17 01/21/2024   CREATININE 1.03 01/21/2024   GFR 81.34 01/21/2024   CALCIUM 9.3 01/21/2024   PROT 6.8 01/21/2024   ALBUMIN 4.3 01/21/2024   BILITOT 0.3 01/21/2024   ALKPHOS 33 (L) 01/21/2024   AST 21 01/21/2024   ALT 20 01/21/2024   Last lipids Lab Results   Component Value Date   CHOL 235 (H) 01/21/2024   HDL 45.40 01/21/2024   LDLCALC 169 (H) 01/21/2024   LDLDIRECT 138.0 12/01/2012   TRIG 106.0 01/21/2024   CHOLHDL 5 01/21/2024   Last hemoglobin A1c Lab Results  Component Value Date   HGBA1C 6.2 (A) 05/10/2024      The 10-year ASCVD risk score (Arnett DK, et al., 2019) is: 5.2%    Assessment & Plan:   #1 prediabetes.  A1c slightly improved to 6.2% which is down from 6.5 previously.  Continue lower glycemic diet and regular exercise.  Consider repeat in 3 to 6 months.  #2 hyperlipidemia.  Recent LDL cholesterol 169.  Previous coronary calcium score of 0.  Discussed heart healthy diet.  He avoids red meat and tends to eat a lot of fish, vegetables, fruits, whole grains, and conscious of healthy fat sources such as virgin olive oil.  Continue heart healthy diet.  #3 vitamin D  deficiency.  Recent level 27.  Continue over-the-counter supplement with 1000 to 2000 international units daily.  Consider repeat vitamin D  level in 3 to 4 months   No follow-ups on file.    Wolm Scarlet, MD     [1]  Allergies Allergen Reactions   Sulfites Rash    Sulfates/sulfites   "
